# Patient Record
Sex: Male | Born: 1970
Health system: Southern US, Community
[De-identification: ages and names within clinical notes are randomized; demographics above are authoritative.]

## PROBLEM LIST (undated history)

## (undated) DIAGNOSIS — E119 Type 2 diabetes mellitus without complications: Secondary | ICD-10-CM

## (undated) DIAGNOSIS — I10 Essential (primary) hypertension: Secondary | ICD-10-CM

## (undated) DIAGNOSIS — E669 Obesity, unspecified: Secondary | ICD-10-CM

---

## 2004-10-02 ENCOUNTER — Ambulatory Visit: Payer: Self-pay | Admitting: Internal Medicine

## 2004-10-09 ENCOUNTER — Ambulatory Visit: Payer: Self-pay | Admitting: Internal Medicine

## 2004-10-09 ENCOUNTER — Ambulatory Visit (HOSPITAL_COMMUNITY): Admission: RE | Admit: 2004-10-09 | Discharge: 2004-10-09 | Payer: Self-pay | Admitting: Internal Medicine

## 2008-05-18 ENCOUNTER — Emergency Department (HOSPITAL_COMMUNITY): Admission: EM | Admit: 2008-05-18 | Discharge: 2008-05-18 | Payer: Self-pay | Admitting: Family Medicine

## 2010-12-01 NOTE — Consult Note (Signed)
NAME:  ENCARNACION, SCIONEAUX NO.:  [id number]   MEDICAL RECORD NO.:  [id number]           PATIENT TYPE:   LOCATION:                                 FACILITY:   PHYSICIAN:  Lionel December, M.D.         DATE OF BIRTH:   DATE OF CONSULTATION:  DATE OF DISCHARGE:                                   CONSULTATION   REQUESTING PHYSICIAN:  Dr. Gerhard Munch, [facility] Department.   REASON FOR CONSULTATION:  Rectal bleeding.   HISTORY OF PRESENT ILLNESS:  Mr. Postema is a 39 year old Hispanic male who  reports an eight-month history of intermittent rectal bleeding.  He noted  about a month ago he would have several episodes of small-volume rectal  bleeding which ranged anywhere from bright red to burgundy.  This persisted  on for a couple of weeks.  Then he had daily episodes for about two weeks.  He noted blood both on the toilet paper and in the commode.  He denies any  associated abdominal pain or rectal pain.  He denies any problems with  constipation, diarrhea or hemorrhoids.  He has been having a bowel movement  two to three times a day.  He denies any use of aspirin, and he does take an  occasional Motrin but not on a daily basis.  He has occasional heartburn or  indigestion, very infrequently, less than once a month, and he does not take  any medications for this.  He denies any dysphagia or odynophagia.  He is  otherwise healthy.   PAST MEDICAL HISTORY:  He denies any.   PAST SURGICAL HISTORY:  He denies any.   CURRENT MEDICATIONS:  Motrin p.r.n.   ALLERGIES:  No known drug allergies.   FAMILY HISTORY:  Non known family history of colorectal carcinoma, liver or  chronic GI problem.  There is a history in his mother, who is in her 53s,  with hypertension and diabetes mellitus.  He has multiple siblings, all of  whom are healthy.   SOCIAL HISTORY:  He has been married for 14 years.  He has six healthy  children, two sons and four daughters.  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  He reports occasional cigarette use, one to two per  week.  He reports drinking about a 12-pack of beer once a week, occasionally  a little more.  Denies any drug use.   REVIEW OF SYSTEMS:  CONSTITUTIONAL:  Weight is stable.  Denies any anorexia.  Denies any fatigue.  Denies any early satiety or insomnia.  CARDIOVASCULAR:  Denies any chest pain or palpitation.  PULMONARY:  Denies any shortness of  breath, dyspnea, cough or hemoptysis.  GASTROINTESTINAL:  See HPI.   PHYSICAL EXAMINATION:  VITAL SIGNS:  Weight 235 pounds, height 62 inches.  Temperature 98.4, blood pressure 118/64, pulse 88.  GENERAL:  Mr. Mechling is a 40 year old Hispanic male who is alert and  oriented, pleasant and cooperative, in no acute distress.  He is obese.  HEENT:  Sclerae clear, nonicteric.  Conjunctivae  pink.  Oropharynx pink and  moist without any lesions.  NECK:  Supple without adenopathy or thyromegaly.  CHEST:  Heart regular rate and rhythm with normal S1, S2, without any  murmurs, clicks, thrills or gallops.  Lungs clear to auscultation  bilaterally.  ABDOMEN:  Protuberant, obese, with positive bowel sounds x4.  No bruits  auscultated.  Soft, nontender, nondistended, without any palpable mass or  hepatosplenomegaly.  No rebound tenderness or guarding.  RECTAL:  No external lesion visualized.  Good sphincter tone.  There were no  internal masses palpated.  A small amount of light brown stool was obtained  from the vault, which is Hemoccult-negative.  EXTREMITIES:  2+ pedal pulses bilaterally.  No edema.  SKIN:  Brown, warm and dry, without any rash or jaundice.   IMPRESSION:  Mr. Krapf is a 40 year old Caucasian male with a history of  small to moderate amounts of rectal bleeding noted after defecation on the  toilet paper and in the commode for the last eight months.  He denies any  associated abdominal pain, rectal pain, constipation or diarrhea.  Most   likely his source of bleeding is benign anorectal source such as internal  hemorrhoids.  Less likely would be diverticular bleeding or ischemic  colitis.  Nonetheless, he needs further evaluation.  I have offered him  colonoscopy versus a flexible sigmoidoscopy given his age.  He prefers to  proceed with flexible sigmoidoscopy, and I feel this is appropriate in this  setting.   RECOMMENDATIONS:  1.  Will schedule flexible sigmoidoscopy with Dr. Karilyn Cota in the near future.      I have discussed this procedure including the risks and benefits with      Mr. Eggert.  He agrees with this.  A signed consent will be obtained.  2.  Further recommendations pending procedure.      KC/MEDQ  D:  10/03/2004  T:  10/03/2004  Job:  [id number]

## 2010-12-01 NOTE — Op Note (Signed)
NAMEKENYA, SHIRAISHI                  ACCOUNT NO.:  [id number]   MEDICAL RECORD NO.:  [id number]          PATIENT TYPE:  AMB   LOCATION:  DAY                           FACILITY:  [facility]   PHYSICIAN:  Lionel December, M.D.    DATE OF BIRTH:  1971-01-03   DATE OF PROCEDURE:  10/09/2004  DATE OF DISCHARGE:                                 OPERATIVE REPORT   PROCEDURE:  Flexible sigmoidoscopy.   INDICATIONS:  Estuardo is a 40 year old Hispanic male with intermittent  hematochezia of eight months' duration. He recently had several episodes. He  was seen by Mr. Criselda Peaches, [facility] department and referred  for further evaluation. States he has not had any more episodes. His bowels  move regularly and he denies abdominal pain. He is undergoing diagnostic  flexible sigmoidoscopy.   Procedure risks were reviewed the patient, informed consent was obtained.   PREMEDICATION:  None.   FINDINGS:  Procedure performed in endoscopy suite. The patient's vital signs  and O2 sat were monitored during procedure and remained stable. The patient  was placed left lateral position and Olympus videoscope was passed in rectum  and rectal examination performed. No abnormality noted on external or  digital exam. Olympus videoscope was placed in the rectum and advanced under  vision into sigmoid colon. Preparation was excellent.  Scope was passed to  the splenic flexure. As the scope was withdrawn, mucosa of descending and  sigmoid colon was carefully examined and was normal. Rectal mucosa similarly  was normal. Scope was retroflexed to examine anorectal junction. Anal canal  was not well seen on this view. However, as the scope was withdrawn,  hemorrhoids were noted involving the anal canal. Endoscope was withdrawn.  The patient tolerated the procedure well.   FINAL DIAGNOSIS:  External hemorrhoids, otherwise normal exam to splenic  flexure.   RECOMMENDATIONS:  He should continue with high-fiber diet.   He can use of Anusol-HC suppository if bleeding episodes recur.  If he does  not respond to therapy, he will give [place] a call.     NR/MEDQ  D:  10/09/2004  T:  10/09/2004  Job:  [id number]   cc:   Earleen Newport, [facility] Dept

## 2015-02-14 ENCOUNTER — Emergency Department (INDEPENDENT_AMBULATORY_CARE_PROVIDER_SITE_OTHER)
Admission: EM | Admit: 2015-02-14 | Discharge: 2015-02-14 | Disposition: A | Discharge status: Home / Self Care | Payer: Self-pay | Source: Home / Self Care | Attending: Internal Medicine | Admitting: Internal Medicine

## 2015-02-14 ENCOUNTER — Encounter (HOSPITAL_COMMUNITY): Payer: Self-pay | Admitting: Emergency Medicine

## 2015-02-14 DIAGNOSIS — G4733 Obstructive sleep apnea (adult) (pediatric): Secondary | ICD-10-CM

## 2015-02-14 DIAGNOSIS — L237 Allergic contact dermatitis due to plants, except food: Secondary | ICD-10-CM

## 2015-02-14 HISTORY — DX: Essential (primary) hypertension: I10

## 2015-02-14 MED ORDER — HYDROCORTISONE 2.5 % EX LOTN: TOPICAL_LOTION | Freq: Two times a day (BID) | CUTANEOUS | Status: DC

## 2015-02-14 NOTE — ED Provider Notes (Signed)
CSN: [id number]     Arrival date & time 02/14/15  1745 History   First MD Initiated Contact with Patient 02/14/15 2010     Chief Complaint  Patient presents with  . Hypertension   (Consider location/radiation/quality/duration/timing/severity/associated sxs/prior Treatment) HPI Comments: The patient reports that he has had some tingling and numbness on the left side of his face and the back of his head this morning when he awoke. He denies difficulty speaking or swallowing. He mentions that the symptoms are present when his blood pressure was high on previous doctor's visits. The patient is also concerned about exposure to poison ivy  The history is provided by the patient.    Past Medical History  Diagnosis Date  . Hypertension    History reviewed. No pertinent past surgical history. History reviewed. No pertinent family history. History  Substance Use Topics  . Smoking status: Never Smoker   . Smokeless tobacco: Never Used  . Alcohol Use: Yes     Comment: once a week    Review of Systems  Constitutional: Negative for fever.  Respiratory: Negative for shortness of breath.   Cardiovascular: Negative for chest pain and palpitations.  Gastrointestinal: Negative for nausea and vomiting.  Neurological: Positive for numbness. Negative for speech difficulty and headaches.       Numbness and tingling left side of face very briefly this morning    Allergies  Review of patient's allergies indicates no known allergies.  Home Medications   Prior to Admission medications   Not on File   BP 117/76 mmHg  Pulse 68  Temp(Src) 97.7 F (36.5 C) (Oral)  Resp 28  SpO2 96% Physical Exam  Constitutional: He is oriented to person, place, and time. He appears well-developed and well-nourished.  HENT:  Head: Normocephalic and atraumatic.  Mouth/Throat: Oropharynx is clear and moist.  Eyes: Conjunctivae and EOM are normal. Pupils are equal, round, and reactive to light. No scleral icterus.   Neck: Normal range of motion. Neck supple. No JVD present.  Cardiovascular: Normal rate, regular rhythm and normal heart sounds.  Exam reveals no gallop and no friction rub.   No murmur heard. Pulmonary/Chest: Effort normal and breath sounds normal.  Abdominal: Soft. Bowel sounds are normal. He exhibits no distension. There is no tenderness.  Musculoskeletal: Normal range of motion. He exhibits no edema.  Neurological: He is alert and oriented to person, place, and time. No cranial nerve deficit.  Skin: Skin is warm and dry. Rash noted.  Erythema and itching of right lower leg  Psychiatric: He has a normal mood and affect.    ED Course  Procedures (including critical care time) Labs Review Labs Reviewed - No data to display  Imaging Review No results found.   MDM  No diagnosis found. Risk reduction for CV event  Arnaldo Natal, MD 02/14/15 2132

## 2015-02-14 NOTE — ED Notes (Signed)
Pt states he had an episode this morning where he felt the left side of his head and neck felt numb.  He states he had a similar episode about two years ago and he was put on BP medications.  He stopped taking them after 6 months.  A few months ago he was prescribed medication for anxiety but he is no longer taking that as well.  Pt states he will feel short of breath or feel dizzy sometimes.

## 2015-02-14 NOTE — Discharge Instructions (Signed)
If you develop symptoms of numbness or weakness and urinary extremities or difficulty swallowing or speaking return to the emergency room immediately. I have recommended the patient begin taking a baby aspirin daily

## 2015-09-03 ENCOUNTER — Encounter (HOSPITAL_COMMUNITY): Payer: Self-pay | Admitting: *Deleted

## 2015-09-03 ENCOUNTER — Emergency Department (INDEPENDENT_AMBULATORY_CARE_PROVIDER_SITE_OTHER)
Admission: EM | Admit: 2015-09-03 | Discharge: 2015-09-03 | Disposition: A | Discharge status: Home / Self Care | Payer: Self-pay | Source: Home / Self Care | Attending: Emergency Medicine | Admitting: Emergency Medicine

## 2015-09-03 DIAGNOSIS — R519 Headache, unspecified: Secondary | ICD-10-CM

## 2015-09-03 DIAGNOSIS — R0789 Other chest pain: Secondary | ICD-10-CM

## 2015-09-03 DIAGNOSIS — R9431 Abnormal electrocardiogram [ECG] [EKG]: Secondary | ICD-10-CM

## 2015-09-03 DIAGNOSIS — R51 Headache: Secondary | ICD-10-CM

## 2015-09-03 HISTORY — DX: Obesity, unspecified: E66.9

## 2015-09-03 MED ORDER — PREDNISONE 50 MG PO TABS: 50.0000 mg | ORAL_TABLET | Freq: Every day | ORAL | Status: DC

## 2015-09-03 MED ORDER — IBUPROFEN 800 MG PO TABS: 800.0000 mg | ORAL_TABLET | Freq: Three times a day (TID) | ORAL | Status: DC | PRN

## 2015-09-03 NOTE — ED Provider Notes (Signed)
HPI  SUBJECTIVE:  Douglas Hayes is a 45 y.o. male who presents with 2 complaints, first, he reports a throbbing, posterior headache, which is now moved to the front of his head. States it feels like a band around his head. States that he has been doing a lot of coughing starting yesterday as well. This started yesterday. He states he has had similar headaches with elevated blood pressure. The headaches last about 30 minutes to an hour and then resolve. He denies nausea, vomiting, nasal congestion, rhinorrhea, neck stiffness, or leg weakness, visual changes, dysarthria, facial droop, photophobia. There are no aggravating or alleviating factors. He has not tried anything for this.   He also reports intermittent, substernal chest pain described as soreness after coughing. It is present only after coughing, and sometimes aggravated with movement/torso rotation. No alleviating factors. He has not tried anything for this. He denies any exertional or positional component to it. There is no radiation up to his neck, through  his chest, or down his arm. He denies nausea, vomiting, diaphoresis, abdominal pain, GERD symptoms. States that the chest pain is not related to the headaches. Patient has a past medical history of hypertension, stopped his medications 3 years ago. No history of MI, diabetes, abnormal EKG, smoking, hypercholesterolemia. Family history negative for MI. PMD: None.    Past Medical History  Diagnosis Date  . Hypertension     off meds since 2016  . Obesity     No past surgical history on file.  No family history on file.  Social History  Substance Use Topics  . Smoking status: Never Smoker   . Smokeless tobacco: Never Used  . Alcohol Use: Yes     Comment: occasionally    No current facility-administered medications for this encounter.  Current outpatient prescriptions:  .  hydrocortisone 2.5 % lotion, Apply topically 2 (two) times daily., Disp: 59 mL, Rfl: 0 .  ibuprofen  (ADVIL,MOTRIN) 800 MG tablet, Take 1 tablet (800 mg total) by mouth every 8 (eight) hours as needed., Disp: 30 tablet, Rfl: 0 .  predniSONE (DELTASONE) 50 MG tablet, Take 1 tablet (50 mg total) by mouth daily with breakfast., Disp: 5 tablet, Rfl: 0  No Known Allergies   ROS  As noted in HPI.   Physical Exam  BP 107/66 mmHg  Pulse 82  Temp(Src) 98.9 F (37.2 C) (Oral)  Resp 16  SpO2 98%  Constitutional: Well developed, well nourished, no acute distress Eyes: PERRL, EOMI, conjunctiva normal bilaterally HENT: Normocephalic, atraumatic,mucus membranes moist. No sinus tenderness. No nasal congestion. Oropharynx normal.  Neck: Positive mild tenderness along bilateral trapezius. C-spine normal. Respiratory: Clear to auscultation bilaterally, no rales, no wheezing, no rhonchi Cardiovascular: Normal rate and rhythm, no murmurs, no gallops, no rubs. No chest wall tenderness. RP 2+ and equal bilaterally. GI: Soft, nondistended, normal bowel sounds, nontender, no rebound, no guarding skin: No rash, skin intact Musculoskeletal: No edema, no tenderness, no deformities Neurologic: Alert & oriented x 3, CN II-XII  intact, no motor deficits, sensation grossly intact Psychiatric: Speech and behavior appropriate   ED Course   Medications - No data to display  Orders Placed This Encounter  Procedures  . ED EKG    Standing Status: Standing     Number of Occurrences: 1     Standing Expiration Date:     Order Specific Question:  Reason for Exam    Answer:  Chest Pain   No results found for this or any previous visit (from  the past 24 hour(s)). No results found.  ED Clinical Impression  Acute nonintractable headache, unspecified headache type  Musculoskeletal chest pain  Abnormal EKG   ED Assessment/Plan  EKG: Normal sinus rhythm, rate 82. Normal axis, normal intervals. Isolated Q wave with T wave inversion in 3, with no reciprocal changes in any of the leads. No previous EKG for  comparison.  Presentation consistent with a musculoskeletal headache, this does not appear to be evidence of stroke or other neurologic emergency. No evidence of sinusitis, otitis, pharyngitis. Chest pain seems to be musculoskeletal as well, as present after coughing only and there is no other exertional positional component. Vitals are normal.  EKG as noted, but there are no EKG changes suggestive of acute ischemia with no reciprocal changes. Plan to send home with NSAIDs, 5 days of prednisone, to cover both headache and the chest pain. will refer to primary care. Had strict discussion on when to go to the emergency room. Patient agrees with plan.   *This clinic note was created using Dragon dictation software. Therefore, there may be occasional mistakes despite careful proofreading.  ?  Domenick Gong, MD 09/03/15 [phone]

## 2015-09-03 NOTE — Discharge Instructions (Signed)
Go to the emergency room if your chest pain changes, gets worse, if you start having some sweatiness, nausea, chest pain, if you have trouble breathing, or for any other concerns. You need to follow up with a primary care physician. See the list below.    Go to www.goodrx.com to look up your medications. This will give you a list of where you can find your prescriptions at the most affordable prices.   This practice is taking new patients. They will see you even if you do not have insurance.  [facility] [address] [place], [place]  [address] [phone]  If you have no primary doctor, here are some resources that may be helpful:  Medicaid-accepting [facility] Providers: - [facility]- [address]  [phone];   - [facility]- [address] [place], [address] [phone]  - [facility]- [address] [phone]  [facility]- [address] [phone]  - Renaye Rakers- [address], [address] [phone]. Only accepts [place] patients after they have her name applied to their card  -Dr. Greggory Stallion Osei-Bonsu, [facility]. [address]    [place], [place] [address]  [phone]  Self Pay (no insurance) in [place]: - Sickle Cell Patients: Dr Willey Blade, [facility] [address] [place] [phone]  - Health [phone]  - Physician Referral Service- [phone]  - [facility]- [address], [place], [phone];  Monday to Friday, 9 a.m. - 7 p.m.; Saturday 9 a.m. to 1 p.m.  [facility]- [address] [place], [place] [phone]  - [facility]- [address]      [phone] - [facility]- [address] [phone]  [facility], [address] [place]; [phone]; or [address] [place]; [phone].   [facility], [place]. [address] [place]; [phone]; Monday to Wednesday, 8:30 a.m. - 5 p.m.; Thursday, 8:30 a.m. - 8 p.m.  [facility], [address], [place]; [phone]; Monday to Friday, 8 a.m. - 4:30 p.m.   [facility], [address] [place], [phone]; first and third Saturday of the month, 9:30 a.m. - 12:30 p.m.  [facility], [address], [place], [phone]; second Saturday of the month, 9 a.m. -noon.  [facility] for children. For information, call [phone]; [phone]; or [phone].  Other agencies that provide inexpensive medical care:     [facility]  [phone]    [facility]  [phone]    [facility]  [phone] [address] [place] [address]    [facility]  [phone]    [facility]  [phone], [phone]; or [phone].  Chronic Pain Problems Contact [facility]  [phone] Patients need to be referred by their primary care doctor.  [facility]  [facility]     [employer]                          [facility] Dept. [address] [place]  [address]      [address] [place] [address]   [phone] (After Hours)  General Information: Finding a doctor when you do not have health insurance can be tricky. Although you are not limited by an insurance plan, you are of course limited by her finances and how much but he can pay out of pocket.  What are your options if you don't have health insurance?   1) Find a Librarian, academic and Pay Out of Pocket Although you won't have to find out who is covered by your insurance plan, it is a good idea to ask around and get recommendations. You will then need to call the office and see if the doctor you have chosen will accept you as a new patient and what types of  options they offer for patients who are self-pay. Some doctors offer discounts or will set up payment plans for their patients who do not have insurance, but you will need to ask so you aren't surprised when you get to your appointment.  2) Contact Your Local Health Department Not all health departments have doctors that can see patients for sick visits, but many do, so it is worth a call to see if yours does. If you don't know where your local health department is, you can check in your phone book. The CDC also has a tool to help you locate your state's health department, and many state websites also have listings of all of their local health departments.  3) Find a Walk-in Clinic If your illness is not likely to be very severe or complicated, you may want to try a walk in clinic. These are popping up all over the country in pharmacies, drugstores, and shopping centers. They're usually staffed by nurse practitioners or physician assistants that have been trained to treat common illnesses and complaints. They're usually fairly quick and inexpensive. However, if you have serious medical issues or chronic medical problems, these are probably not your best option

## 2015-09-03 NOTE — ED Notes (Addendum)
C/O mid-upper chest pressure since yesterday; pain has been intermittent.  Denies any radiation of pain; no change with palpation or movement.  Denies SOB, n/v, diaphoresis.  Has hx HTN, but stopped meds last year.  Last night also started with posterior neck pain, now radiating up into frontal HA.  States he did start with a slight cough yesterday.

## 2015-10-10 ENCOUNTER — Encounter (HOSPITAL_COMMUNITY): Payer: Self-pay | Admitting: *Deleted

## 2015-10-10 DIAGNOSIS — K3589 Other acute appendicitis: Principal | ICD-10-CM | POA: Insufficient documentation

## 2015-10-10 LAB — CBC
HCT: 42.7 % (ref 39.0–52.0)
HEMOGLOBIN: 14.5 g/dL (ref 13.0–17.0)
MCH: 28.5 pg (ref 26.0–34.0)
MCHC: 34 g/dL (ref 30.0–36.0)
MCV: 84.1 fL (ref 78.0–100.0)
PLATELETS: 222 10*3/uL (ref 150–400)
RBC: 5.08 MIL/uL (ref 4.22–5.81)
RDW: 14.1 % (ref 11.5–15.5)
WBC: 15.3 10*3/uL — AB (ref 4.0–10.5)

## 2015-10-10 LAB — LIPASE, BLOOD: LIPASE: 26 U/L (ref 11–51)

## 2015-10-10 LAB — COMPREHENSIVE METABOLIC PANEL
ALT: 25 U/L (ref 17–63)
ANION GAP: 8 (ref 5–15)
AST: 26 U/L (ref 15–41)
Albumin: 3.9 g/dL (ref 3.5–5.0)
Alkaline Phosphatase: 35 U/L — ABNORMAL LOW (ref 38–126)
BUN: 17 mg/dL (ref 6–20)
CHLORIDE: 102 mmol/L (ref 101–111)
CO2: 24 mmol/L (ref 22–32)
Calcium: 8.5 mg/dL — ABNORMAL LOW (ref 8.9–10.3)
Creatinine, Ser: 0.72 mg/dL (ref 0.61–1.24)
GFR calc non Af Amer: >60 mL/min (ref >60)
Glucose, Bld: 146 mg/dL — ABNORMAL HIGH (ref 65–99)
POTASSIUM: 3.8 mmol/L (ref 3.5–5.1)
SODIUM: 134 mmol/L — AB (ref 135–145)
Total Bilirubin: 0.6 mg/dL (ref 0.3–1.2)
Total Protein: 7.3 g/dL (ref 6.5–8.1)

## 2015-10-10 NOTE — ED Notes (Signed)
Pt c/o n/v and abdominal pain that started today 

## 2015-10-11 ENCOUNTER — Encounter (HOSPITAL_COMMUNITY)
Admission: EM | Disposition: A | Discharge status: Home / Self Care | Payer: Self-pay | Source: Home / Self Care | Attending: Emergency Medicine

## 2015-10-11 ENCOUNTER — Encounter (HOSPITAL_COMMUNITY): Payer: Self-pay

## 2015-10-11 ENCOUNTER — Emergency Department (HOSPITAL_COMMUNITY): Payer: Self-pay

## 2015-10-11 ENCOUNTER — Observation Stay (HOSPITAL_COMMUNITY)
Admission: EM | Admit: 2015-10-11 | Discharge: 2015-10-12 | Disposition: A | Discharge status: Home / Self Care | Payer: Self-pay | Attending: General Surgery | Admitting: General Surgery

## 2015-10-11 ENCOUNTER — Emergency Department (HOSPITAL_COMMUNITY): Payer: Self-pay | Admitting: Anesthesiology

## 2015-10-11 DIAGNOSIS — K3580 Unspecified acute appendicitis: Secondary | ICD-10-CM | POA: Diagnosis present

## 2015-10-11 DIAGNOSIS — K3589 Other acute appendicitis without perforation or gangrene: Secondary | ICD-10-CM

## 2015-10-11 HISTORY — PX: LAPAROSCOPIC APPENDECTOMY: SHX408

## 2015-10-11 LAB — URINALYSIS, ROUTINE W REFLEX MICROSCOPIC
BILIRUBIN URINE: NEGATIVE
Glucose, UA: NEGATIVE mg/dL
HGB URINE DIPSTICK: NEGATIVE
Ketones, ur: NEGATIVE mg/dL
Leukocytes, UA: NEGATIVE
Nitrite: NEGATIVE
PROTEIN: NEGATIVE mg/dL
SPECIFIC GRAVITY, URINE: 1.01 (ref 1.005–1.030)
pH: 8 (ref 5.0–8.0)

## 2015-10-11 SURGERY — APPENDECTOMY, LAPAROSCOPIC
Anesthesia: General | Site: Abdomen

## 2015-10-11 MED ORDER — FENTANYL CITRATE (PF) 250 MCG/5ML IJ SOLN
INTRAMUSCULAR | Status: AC
  Filled 2015-10-11: qty 5

## 2015-10-11 MED ORDER — LORAZEPAM 2 MG/ML IJ SOLN: 1.0000 mg | INTRAMUSCULAR | Status: DC | PRN

## 2015-10-11 MED ORDER — ACETAMINOPHEN 650 MG RE SUPP: 650.0000 mg | Freq: Four times a day (QID) | RECTAL | Status: DC | PRN

## 2015-10-11 MED ORDER — PROPOFOL 10 MG/ML IV BOLUS
INTRAVENOUS | Status: AC
  Filled 2015-10-11: qty 20

## 2015-10-11 MED ORDER — GLYCOPYRROLATE 0.2 MG/ML IJ SOLN
INTRAMUSCULAR | Status: AC
  Filled 2015-10-11: qty 1

## 2015-10-11 MED ORDER — GLYCOPYRROLATE 0.2 MG/ML IJ SOLN
INTRAMUSCULAR | Status: AC
  Filled 2015-10-11: qty 3

## 2015-10-11 MED ORDER — ONDANSETRON HCL 4 MG/2ML IJ SOLN
INTRAMUSCULAR | Status: AC
  Filled 2015-10-11: qty 2

## 2015-10-11 MED ORDER — LIDOCAINE HCL (CARDIAC) 10 MG/ML IV SOLN
INTRAVENOUS | Status: DC | PRN
  Administered 2015-10-11: 50 mg via INTRAVENOUS

## 2015-10-11 MED ORDER — FENTANYL CITRATE (PF) 100 MCG/2ML IJ SOLN
25.0000 ug | INTRAMUSCULAR | Status: DC | PRN
  Administered 2015-10-11: 50 ug via INTRAVENOUS
  Filled 2015-10-11: qty 2

## 2015-10-11 MED ORDER — LIDOCAINE HCL (PF) 1 % IJ SOLN
INTRAMUSCULAR | Status: AC
  Filled 2015-10-11: qty 5

## 2015-10-11 MED ORDER — ONDANSETRON HCL 4 MG/2ML IJ SOLN
4.0000 mg | Freq: Once | INTRAMUSCULAR | Status: AC
  Administered 2015-10-11: 4 mg via INTRAVENOUS
  Filled 2015-10-11: qty 2

## 2015-10-11 MED ORDER — OXYCODONE-ACETAMINOPHEN 5-325 MG PO TABS
1.0000 | ORAL_TABLET | ORAL | Status: DC | PRN
  Administered 2015-10-11 (×2): 2 via ORAL
  Filled 2015-10-11 (×2): qty 2

## 2015-10-11 MED ORDER — POVIDONE-IODINE 10 % EX OINT
TOPICAL_OINTMENT | CUTANEOUS | Status: AC
  Filled 2015-10-11: qty 1

## 2015-10-11 MED ORDER — ONDANSETRON HCL 4 MG/2ML IJ SOLN: 4.0000 mg | Freq: Four times a day (QID) | INTRAMUSCULAR | Status: DC | PRN

## 2015-10-11 MED ORDER — SIMETHICONE 80 MG PO CHEW: 40.0000 mg | CHEWABLE_TABLET | Freq: Four times a day (QID) | ORAL | Status: DC | PRN

## 2015-10-11 MED ORDER — DEXAMETHASONE SODIUM PHOSPHATE 4 MG/ML IJ SOLN
INTRAMUSCULAR | Status: AC
Start: 2015-10-11 — End: 2015-10-11
  Filled 2015-10-11: qty 1

## 2015-10-11 MED ORDER — ONDANSETRON HCL 4 MG/2ML IJ SOLN: 4.0000 mg | Freq: Once | INTRAMUSCULAR | Status: DC | PRN

## 2015-10-11 MED ORDER — ENOXAPARIN SODIUM 40 MG/0.4ML ~~LOC~~ SOLN: 40.0000 mg | SUBCUTANEOUS | Status: DC

## 2015-10-11 MED ORDER — SUCCINYLCHOLINE CHLORIDE 20 MG/ML IJ SOLN
INTRAMUSCULAR | Status: AC
  Filled 2015-10-11: qty 1

## 2015-10-11 MED ORDER — PIPERACILLIN-TAZOBACTAM 3.375 G IVPB 30 MIN
3.3750 g | Freq: Once | INTRAVENOUS | Status: AC
  Administered 2015-10-11: 3.375 g via INTRAVENOUS
  Filled 2015-10-11: qty 50

## 2015-10-11 MED ORDER — POVIDONE-IODINE 10 % OINT PACKET
TOPICAL_OINTMENT | CUTANEOUS | Status: DC | PRN
  Administered 2015-10-11: 1 via TOPICAL

## 2015-10-11 MED ORDER — INFLUENZA VAC SPLIT QUAD 0.5 ML IM SUSY
0.5000 mL | PREFILLED_SYRINGE | INTRAMUSCULAR | Status: AC
Start: 2015-10-12 — End: 2015-10-12
  Administered 2015-10-12: 0.5 mL via INTRAMUSCULAR
  Filled 2015-10-11 (×2): qty 0.5

## 2015-10-11 MED ORDER — PROPOFOL 10 MG/ML IV BOLUS
INTRAVENOUS | Status: DC | PRN
  Administered 2015-10-11: 20 mg via INTRAVENOUS
  Administered 2015-10-11: 180 mg via INTRAVENOUS

## 2015-10-11 MED ORDER — FENTANYL CITRATE (PF) 100 MCG/2ML IJ SOLN
INTRAMUSCULAR | Status: DC | PRN
  Administered 2015-10-11 (×2): 50 ug via INTRAVENOUS

## 2015-10-11 MED ORDER — BUPIVACAINE HCL (PF) 0.5 % IJ SOLN
INTRAMUSCULAR | Status: DC | PRN
  Administered 2015-10-11: 10 mL

## 2015-10-11 MED ORDER — MIDAZOLAM HCL 2 MG/2ML IJ SOLN
INTRAMUSCULAR | Status: AC
  Filled 2015-10-11: qty 2

## 2015-10-11 MED ORDER — DEXAMETHASONE SODIUM PHOSPHATE 4 MG/ML IJ SOLN
4.0000 mg | Freq: Once | INTRAMUSCULAR | Status: AC
  Administered 2015-10-11: 4 mg via INTRAVENOUS

## 2015-10-11 MED ORDER — MORPHINE SULFATE (PF) 4 MG/ML IV SOLN
4.0000 mg | Freq: Once | INTRAVENOUS | Status: AC
Start: 2015-10-11 — End: 2015-10-11
  Administered 2015-10-11: 4 mg via INTRAVENOUS
  Filled 2015-10-11: qty 1

## 2015-10-11 MED ORDER — ONDANSETRON HCL 4 MG/2ML IJ SOLN
4.0000 mg | Freq: Once | INTRAMUSCULAR | Status: AC
  Administered 2015-10-11: 4 mg via INTRAVENOUS

## 2015-10-11 MED ORDER — KETOROLAC TROMETHAMINE 30 MG/ML IJ SOLN
30.0000 mg | Freq: Once | INTRAMUSCULAR | Status: AC
  Administered 2015-10-11: 30 mg via INTRAVENOUS
  Filled 2015-10-11: qty 1

## 2015-10-11 MED ORDER — IOPAMIDOL (ISOVUE-300) INJECTION 61%
100.0000 mL | Freq: Once | INTRAVENOUS | Status: AC | PRN
  Administered 2015-10-11: 100 mL via INTRAVENOUS

## 2015-10-11 MED ORDER — NEOSTIGMINE METHYLSULFATE 10 MG/10ML IV SOLN
INTRAVENOUS | Status: DC | PRN
  Administered 2015-10-11 (×2): 1 mg via INTRAVENOUS
  Administered 2015-10-11: 4 mg via INTRAVENOUS

## 2015-10-11 MED ORDER — ROCURONIUM BROMIDE 50 MG/5ML IV SOLN
INTRAVENOUS | Status: AC
  Filled 2015-10-11: qty 2

## 2015-10-11 MED ORDER — GLYCOPYRROLATE 0.2 MG/ML IJ SOLN
INTRAMUSCULAR | Status: DC | PRN
  Administered 2015-10-11: 0.6 mg via INTRAVENOUS
  Administered 2015-10-11 (×2): 0.2 mg via INTRAVENOUS

## 2015-10-11 MED ORDER — HYDROMORPHONE HCL 1 MG/ML IJ SOLN: 1.0000 mg | INTRAMUSCULAR | Status: DC | PRN

## 2015-10-11 MED ORDER — ACETAMINOPHEN 325 MG PO TABS: 650.0000 mg | ORAL_TABLET | Freq: Four times a day (QID) | ORAL | Status: DC | PRN

## 2015-10-11 MED ORDER — SODIUM CHLORIDE 0.9 % IR SOLN
Status: DC | PRN
  Administered 2015-10-11: 1000 mL

## 2015-10-11 MED ORDER — DIPHENHYDRAMINE HCL 50 MG/ML IJ SOLN: 25.0000 mg | Freq: Four times a day (QID) | INTRAMUSCULAR | Status: DC | PRN

## 2015-10-11 MED ORDER — SUCCINYLCHOLINE CHLORIDE 20 MG/ML IJ SOLN
INTRAMUSCULAR | Status: DC | PRN
  Administered 2015-10-11: 140 mg via INTRAVENOUS

## 2015-10-11 MED ORDER — MIDAZOLAM HCL 2 MG/2ML IJ SOLN
1.0000 mg | INTRAMUSCULAR | Status: DC | PRN
  Administered 2015-10-11: 2 mg via INTRAVENOUS

## 2015-10-11 MED ORDER — LACTATED RINGERS IV SOLN
INTRAVENOUS | Status: DC
  Administered 2015-10-11 (×2): via INTRAVENOUS

## 2015-10-11 MED ORDER — PIPERACILLIN-TAZOBACTAM 3.375 G IVPB
3.3750 g | Freq: Three times a day (TID) | INTRAVENOUS | Status: DC
  Administered 2015-10-11 – 2015-10-12 (×3): 3.375 g via INTRAVENOUS
  Filled 2015-10-11 (×6): qty 50

## 2015-10-11 MED ORDER — LACTATED RINGERS IV SOLN
INTRAVENOUS | Status: DC
  Administered 2015-10-11: 14:00:00 via INTRAVENOUS

## 2015-10-11 MED ORDER — BUPIVACAINE HCL (PF) 0.5 % IJ SOLN
INTRAMUSCULAR | Status: AC
  Filled 2015-10-11: qty 30

## 2015-10-11 MED ORDER — DIPHENHYDRAMINE HCL 25 MG PO CAPS: 25.0000 mg | ORAL_CAPSULE | Freq: Four times a day (QID) | ORAL | Status: DC | PRN

## 2015-10-11 MED ORDER — ONDANSETRON 4 MG PO TBDP: 4.0000 mg | ORAL_TABLET | Freq: Four times a day (QID) | ORAL | Status: DC | PRN

## 2015-10-11 MED ORDER — SODIUM CHLORIDE 0.9 % IV BOLUS (SEPSIS)
1000.0000 mL | Freq: Once | INTRAVENOUS | Status: AC
  Administered 2015-10-11: 1000 mL via INTRAVENOUS

## 2015-10-11 MED ORDER — ROCURONIUM BROMIDE 100 MG/10ML IV SOLN
INTRAVENOUS | Status: DC | PRN
  Administered 2015-10-11: 35 mg via INTRAVENOUS
  Administered 2015-10-11: 5 mg via INTRAVENOUS
  Administered 2015-10-11: 10 mg via INTRAVENOUS

## 2015-10-11 SURGICAL SUPPLY — 51 items
APPLICATOR COTTON TIP 6IN STRL (MISCELLANEOUS) ×2 IMPLANT
BAG HAMPER (MISCELLANEOUS) ×3 IMPLANT
BAG SPEC RTRVL LRG 6X4 10 (ENDOMECHANICALS) ×1
CHLORAPREP W/TINT 26ML (MISCELLANEOUS) ×3 IMPLANT
CLOTH BEACON ORANGE TIMEOUT ST (SAFETY) ×3 IMPLANT
COVER LIGHT HANDLE STERIS (MISCELLANEOUS) ×6 IMPLANT
CUTTER FLEX LINEAR 45M (STAPLE) ×2 IMPLANT
CUTTER LINEAR ENDO 35 ART FLEX (STAPLE) IMPLANT
DECANTER SPIKE VIAL GLASS SM (MISCELLANEOUS) ×3 IMPLANT
ELECT REM PT RETURN 9FT ADLT (ELECTROSURGICAL) ×3
ELECTRODE REM PT RTRN 9FT ADLT (ELECTROSURGICAL) ×1 IMPLANT
EVACUATOR SMOKE 8.L (FILTER) ×3 IMPLANT
FORMALIN 10 PREFIL 120ML (MISCELLANEOUS) ×3 IMPLANT
GLOVE BIO SURGEON STRL SZ7 (GLOVE) ×2 IMPLANT
GLOVE BIOGEL PI IND STRL 7.0 (GLOVE) ×1 IMPLANT
GLOVE BIOGEL PI INDICATOR 7.0 (GLOVE) ×2
GLOVE EXAM NITRILE MD LF STRL (GLOVE) ×2 IMPLANT
GLOVE SURG SS PI 7.5 STRL IVOR (GLOVE) ×3 IMPLANT
GOWN STRL REUS W/ TWL XL LVL3 (GOWN DISPOSABLE) ×1 IMPLANT
GOWN STRL REUS W/TWL LRG LVL3 (GOWN DISPOSABLE) ×3 IMPLANT
GOWN STRL REUS W/TWL XL LVL3 (GOWN DISPOSABLE) ×3
INST SET LAPROSCOPIC AP (KITS) ×3 IMPLANT
IV NS IRRIG 3000ML ARTHROMATIC (IV SOLUTION) IMPLANT
KIT ROOM TURNOVER APOR (KITS) ×3 IMPLANT
MANIFOLD NEPTUNE II (INSTRUMENTS) ×3 IMPLANT
NDL INSUFFLATION 14GA 120MM (NEEDLE) ×1 IMPLANT
NEEDLE INSUFFLATION 14GA 120MM (NEEDLE) ×3 IMPLANT
NS IRRIG 1000ML POUR BTL (IV SOLUTION) ×3 IMPLANT
PACK LAP CHOLE LZT030E (CUSTOM PROCEDURE TRAY) ×3 IMPLANT
PAD ARMBOARD 7.5X6 YLW CONV (MISCELLANEOUS) ×3 IMPLANT
PENCIL HANDSWITCHING (ELECTRODE) ×2 IMPLANT
POUCH SPECIMEN RETRIEVAL 10MM (ENDOMECHANICALS) ×3 IMPLANT
RELOAD 45 VASCULAR/THIN (ENDOMECHANICALS) IMPLANT
RELOAD STAPLE 45 2.5 WHT GRN (ENDOMECHANICALS) IMPLANT
RELOAD STAPLE 45 3.5 BLU ETS (ENDOMECHANICALS) ×1 IMPLANT
RELOAD STAPLE TA45 3.5 REG BLU (ENDOMECHANICALS) ×3 IMPLANT
SET BASIN LINEN APH (SET/KITS/TRAYS/PACK) ×3 IMPLANT
SET TUBE IRRIG SUCTION NO TIP (IRRIGATION / IRRIGATOR) IMPLANT
SHEARS HARMONIC ACE PLUS 36CM (ENDOMECHANICALS) ×3 IMPLANT
SPONGE GAUZE 2X2 8PLY STER LF (GAUZE/BANDAGES/DRESSINGS) ×3
SPONGE GAUZE 2X2 8PLY STRL LF (GAUZE/BANDAGES/DRESSINGS) ×6 IMPLANT
STAPLER VISISTAT (STAPLE) ×3 IMPLANT
SUT VICRYL 0 UR6 27IN ABS (SUTURE) ×3 IMPLANT
TAPE CLOTH SURG 4X10 WHT LF (GAUZE/BANDAGES/DRESSINGS) ×2 IMPLANT
TRAY FOLEY CATH SILVER 16FR (SET/KITS/TRAYS/PACK) ×3 IMPLANT
TROCAR ENDO BLADELESS 11MM (ENDOMECHANICALS) ×3 IMPLANT
TROCAR ENDO BLADELESS 12MM (ENDOMECHANICALS) ×3 IMPLANT
TROCAR XCEL NON-BLD 5MMX100MML (ENDOMECHANICALS) ×3 IMPLANT
TUBING INSUFFLATION (TUBING) ×3 IMPLANT
WARMER LAPAROSCOPE (MISCELLANEOUS) ×3 IMPLANT
YANKAUER SUCT 12FT TUBE ARGYLE (SUCTIONS) ×1 IMPLANT

## 2015-10-11 NOTE — H&P (Signed)
Douglas Hayes is an 45 y.o. male.   Chief Complaint: Right-sided abdominal pain HPI: Patient is a 45 year old Hispanic male who presents with a less than 24-hour history of worsening right sided abdominal pain. He has never had this pain before. CT scan of the abdomen reveals acute appendicitis. There was no evidence of perforation.  History reviewed. No pertinent past medical history.  History reviewed. No pertinent past surgical history.  History reviewed. No pertinent family history. Social History:  reports that he has never smoked. He does not have any smokeless tobacco history on file. He reports that he drinks alcohol. He reports that he does not use illicit drugs.  Allergies: No Known Allergies   (Not in a hospital admission)  Results for orders placed or performed during the hospital encounter of 10/11/15 (from the past 48 hour(s))  Lipase, blood     Status: None   Collection Time: 10/10/15 11:18 PM  Result Value Ref Range   Lipase 26 11 - 51 U/L  Comprehensive metabolic panel     Status: Abnormal   Collection Time: 10/10/15 11:18 PM  Result Value Ref Range   Sodium 134 (L) 135 - 145 mmol/L   Potassium 3.8 3.5 - 5.1 mmol/L   Chloride 102 101 - 111 mmol/L   CO2 24 22 - 32 mmol/L   Glucose, Bld 146 (H) 65 - 99 mg/dL   BUN 17 6 - 20 mg/dL   Creatinine, Ser 0.72 0.61 - 1.24 mg/dL   Calcium 8.5 (L) 8.9 - 10.3 mg/dL   Total Protein 7.3 6.5 - 8.1 g/dL   Albumin 3.9 3.5 - 5.0 g/dL   AST 26 15 - 41 U/L   ALT 25 17 - 63 U/L   Alkaline Phosphatase 35 (L) 38 - 126 U/L   Total Bilirubin 0.6 0.3 - 1.2 mg/dL   GFR calc non Af Amer >60 >60 mL/min   GFR calc Af Amer >60 >60 mL/min    Comment: (NOTE) The eGFR has been calculated using the CKD EPI equation. This calculation has not been validated in all clinical situations. eGFR's persistently <60 mL/min signify possible Chronic Kidney Disease.    Anion gap 8 5 - 15  CBC     Status: Abnormal   Collection Time: 10/10/15  11:18 PM  Result Value Ref Range   WBC 15.3 (H) 4.0 - 10.5 K/uL   RBC 5.08 4.22 - 5.81 MIL/uL   Hemoglobin 14.5 13.0 - 17.0 g/dL   HCT 42.7 39.0 - 52.0 %   MCV 84.1 78.0 - 100.0 fL   MCH 28.5 26.0 - 34.0 pg   MCHC 34.0 30.0 - 36.0 g/dL   RDW 14.1 11.5 - 15.5 %   Platelets 222 150 - 400 K/uL  Urinalysis, Routine w reflex microscopic (not at [facility])     Status: Abnormal   Collection Time: 10/11/15  1:06 AM  Result Value Ref Range   Color, Urine AMBER (A) YELLOW    Comment: BIOCHEMICALS MAY BE AFFECTED BY COLOR   APPearance CLEAR CLEAR   Specific Gravity, Urine 1.010 1.005 - 1.030   pH 8.0 5.0 - 8.0   Glucose, UA NEGATIVE NEGATIVE mg/dL   Hgb urine dipstick NEGATIVE NEGATIVE   Bilirubin Urine NEGATIVE NEGATIVE   Ketones, ur NEGATIVE NEGATIVE mg/dL   Protein, ur NEGATIVE NEGATIVE mg/dL   Nitrite NEGATIVE NEGATIVE   Leukocytes, UA NEGATIVE NEGATIVE    Comment: MICROSCOPIC NOT DONE ON URINES WITH NEGATIVE PROTEIN, BLOOD, LEUKOCYTES, NITRITE, OR GLUCOSE <1000  mg/dL.   Ct Abdomen Pelvis W Contrast  10/11/2015  CLINICAL DATA:  Acute onset right lower quadrant abdominal pain. Nausea and vomiting. Initial encounter. EXAM: CT ABDOMEN AND PELVIS WITH CONTRAST TECHNIQUE: Multidetector CT imaging of the abdomen and pelvis was performed using the standard protocol following bolus administration of intravenous contrast. CONTRAST:  169m ISOVUE-300 IOPAMIDOL (ISOVUE-300) INJECTION 61% COMPARISON:  None. FINDINGS: The visualized lung bases are clear. The liver and spleen are unremarkable in appearance. The gallbladder is within normal limits. The pancreas and adrenal glands are unremarkable. The kidneys are unremarkable in appearance. There is no evidence of hydronephrosis. No renal or ureteral stones are seen. No perinephric stranding is appreciated. The small bowel is unremarkable in appearance. The stomach is within normal limits. No acute vascular abnormalities are seen. The appendix is dilated to 1.5 cm  in maximal diameter, with surrounding soft tissue inflammation and trace fluid, compatible with acute appendicitis. There is no evidence of perforation or abscess formation at this time. The colon is largely decompressed and is unremarkable in appearance. The bladder is moderately distended and grossly unremarkable. The prostate remains normal in size. A small left inguinal hernia is seen, containing only fat. No inguinal lymphadenopathy is seen. No acute osseous abnormalities are identified. IMPRESSION: 1. Acute appendicitis, with dilatation of the appendix to 1.5 cm in maximal diameter, surrounding soft tissue inflammation and trace fluid. No evidence of perforation or abscess formation at this time. 2. Small left inguinal hernia, containing only fat. These results were called by telephone at the time of interpretation on 10/11/2015 at 5:02 am to Dr. CThayer Jew who verbally acknowledged these results. Electronically Signed   By: JGarald BaldingM.D.   On: 10/11/2015 05:03    Review of Systems  Constitutional: Positive for malaise/fatigue.  HENT: Negative.   Eyes: Negative.   Respiratory: Negative.   Cardiovascular: Negative.   Gastrointestinal: Positive for abdominal pain.  Genitourinary: Negative.   Musculoskeletal: Negative.   Skin: Negative.     Blood pressure 126/75, pulse 73, temperature 98.3 F (36.8 C), temperature source Oral, resp. rate 17, height '5\' 6"'$  (1.676 m), weight 118.842 kg (262 lb), SpO2 94 %. Physical Exam  Vitals reviewed. Constitutional: He is oriented to person, place, and time. He appears well-developed and well-nourished.  HENT:  Head: Normocephalic and atraumatic.  Neck: Normal range of motion. Neck supple.  Cardiovascular: Normal rate, regular rhythm and normal heart sounds.   Respiratory: Effort normal and breath sounds normal.  GI: Soft. He exhibits no distension. There is tenderness. There is no rebound.  Tender on the right side of the abdomen to  palpation. No rigidity noted.  Neurological: He is alert and oriented to person, place, and time.  Skin: Skin is warm and dry.     Assessment/Plan Impression: Acute appendicitis Plan: Patient be taken to the operating room for laparoscopic appendectomy. The risks and benefits of the procedure including bleeding, infection, and the possibility of an open procedure were fully explained to the patient, who gave informed consent.  JJamesetta So MD 10/11/2015, 7:16 AM

## 2015-10-11 NOTE — ED Provider Notes (Signed)
CSN: [id number]     Arrival date & time 10/10/15  2247 History   First MD Initiated Contact with Patient 10/11/15 [phone]     Chief Complaint  Patient presents with  . Abdominal Pain     (Consider location/radiation/quality/duration/timing/severity/associated sxs/prior Treatment) HPI  This is a 45 year old male who presents with a one-day history of abdominal pain. He states that it comes and goes. It is central and nonradiating. Currently his pain is 8 out of 10. He denies any urinary symptoms. He denies any fever. Onset of symptoms was 1 PM yesterday. He has not taken anything for pain. He does report nausea. No diarrhea.  History reviewed. No pertinent past medical history. History reviewed. No pertinent past surgical history. History reviewed. No pertinent family history. Social History  Substance Use Topics  . Smoking status: Never Smoker   . Smokeless tobacco: None  . Alcohol Use: Yes     Comment: some on weekends    Review of Systems  Constitutional: Negative for fever.  Respiratory: Negative for shortness of breath.   Cardiovascular: Negative for chest pain.  Gastrointestinal: Positive for nausea and abdominal pain. Negative for vomiting and diarrhea.  Genitourinary: Negative for dysuria and hematuria.  All other systems reviewed and are negative.     Allergies  Review of patient's allergies indicates no known allergies.  Home Medications   Prior to Admission medications   Not on File   BP 105/66 mmHg  Pulse 76  Temp(Src) 98.3 F (36.8 C) (Oral)  Resp 20  Ht  (1.676 m)  Wt 262 lb (118.842 kg)  BMI 42.31 kg/m2  SpO2 94% Physical Exam  Constitutional: He is oriented to person, place, and time. He appears well-developed and well-nourished.  Overweight, no acute distress  HENT:  Head: Normocephalic and atraumatic.  Cardiovascular: Normal rate, regular rhythm and normal heart sounds.   No murmur heard. Pulmonary/Chest: Effort normal and breath sounds  normal. No respiratory distress. He has no wheezes.  Abdominal: Soft. Bowel sounds are normal. There is tenderness. There is no rebound.  Tenderness to palpation right mid abdomen and the umbilical region, no rebound or guarding  Musculoskeletal: He exhibits no edema.  Neurological: He is alert and oriented to person, place, and time.  Skin: Skin is warm and dry.  Psychiatric: He has a normal mood and affect.  Nursing note and vitals reviewed.   ED Course  Procedures (including critical care time) Labs Review Labs Reviewed  COMPREHENSIVE METABOLIC PANEL - Abnormal; Notable for the following:    Sodium 134 (*)    Glucose, Bld 146 (*)    Calcium 8.5 (*)    Alkaline Phosphatase 35 (*)    All other components within normal limits  CBC - Abnormal; Notable for the following:    WBC 15.3 (*)    All other components within normal limits  URINALYSIS, ROUTINE W REFLEX MICROSCOPIC (NOT AT [facility]) - Abnormal; Notable for the following:    Color, Urine AMBER (*)    All other components within normal limits  LIPASE, BLOOD    Imaging Review No results found. I have personally reviewed and evaluated these images and lab results as part of my medical decision-making.   EKG Interpretation None      MDM   Final diagnoses:  Other acute appendicitis    Patient presents with abdominal pain and nausea. Onset of symptoms last 24 hours. He is nontoxic-appearing. Afebrile. Lab work reviewed from triage. White count of 15. He is  tender on exam without signs of peritonitis. Given that the pain is periumbilical and right-sided, appendicitis is a consideration. Patient was given pain and nausea medication. CT scan does show acute uncomplicated appendicitis. Will consult general surgery.    Shon Batonourtney F Mirielle Byrum, MD 10/11/15 [phone]

## 2015-10-11 NOTE — Op Note (Signed)
Patient:  Douglas Hayes  DOB:  1971/03/12  MRN:  [id number]   Preop Diagnosis:  Acute appendicitis  Postop Diagnosis:  Same  Procedure:  Laparoscopic appendectomy  Surgeon:  Franky MachoMark Bence Trapp, M.D.  Anes:  Gen. endotracheal  Indications:  Patient is a 45 year old Hispanic male who presents with a less than 24-hour history of worsening right-sided abdominal pain. CT scan of the abdomen reveals acute appendicitis. The risks and benefits of the procedure including bleeding, infection, and the possibility of an open procedure were fully explained to the patient, who gave informed consent.  Procedure note:  The patient was placed the supine position. After induction of general endotracheal anesthesia, the abdomen was prepped and draped using the usual sterile technique with DuraPrep. Surgical site confirmation was performed.  A supraumbilical incision was made down to the fascia. A Veress needle was introduced into the abdominal cavity and confirmation of placement was done using the saline drop test. The abdomen was then insufflated to 17 mmHg pressure. An 11 mm trocar was introduced into the abdominal cavity under direct visualization without difficulty. The patient was placed in deeper Trendelenburg position and an additional 12 mm trocar was placed the suprapubic region and a 5 mm trocar was placed left lower quadrant region. The appendix was identified and noted to be diffusely inflamed. There was no evidence of perforation. The mesial appendix was divided using the harmonic scalpel. A standard Endo GIA was placed across the base the appendix and fired. The appendix was then removed using an Endo Catch bag without difficulty. The staple line was inspected and noted to be intact. All fluid and air were then evacuated from the abdominal cavity prior to removal of the trochars.  All wounds were irrigated with normal saline. All wounds were injected with 0.5% Sensorcaine. The suprabuccal fashion  was reapproximated using an 0 Vicryl interrupted suture. All skin incisions were closed using staples. Betadine ointment and dry sterile dressings were applied.  All tape and needle counts were correct at the end of the procedure. Patient was extubated in the operating room and transferred to PACU in stable condition.  Complications:  None  EBL:  Minimal  Specimen:  Appendix

## 2015-10-11 NOTE — ED Notes (Signed)
Pt brought to OR, fully undressed, belongings in pt belonging bag.

## 2015-10-11 NOTE — Addendum Note (Signed)
Addendum  created 10/11/15 1018 by Franco Noneseresa S Crystelle Ferrufino, CRNA   Modules edited: Anesthesia Events, Anesthesia Flowsheet

## 2015-10-11 NOTE — Anesthesia Preprocedure Evaluation (Addendum)
Anesthesia Evaluation  Patient identified by MRN, date of birth, ID band Patient awake    Reviewed: Allergy & Precautions, NPO status , Patient's Chart, lab work & pertinent test results  Airway Mallampati: III  TM Distance: >3 FB Neck ROM: Full    Dental  (+) Teeth Intact, Dental Advisory Given   Pulmonary neg pulmonary ROS,    breath sounds clear to auscultation       Cardiovascular negative cardio ROS   Rhythm:Regular Rate:Normal     Neuro/Psych    GI/Hepatic negative GI ROS,   Endo/Other  Morbid obesity  Renal/GU      Musculoskeletal   Abdominal   Peds  Hematology   Anesthesia Other Findings   Reproductive/Obstetrics                            Anesthesia Physical Anesthesia Plan  ASA: II and emergent  Anesthesia Plan: General   Post-op Pain Management:    Induction: Intravenous, Rapid sequence and Cricoid pressure planned  Airway Management Planned: Oral ETT and Video Laryngoscope Planned  Additional Equipment:   Intra-op Plan:   Post-operative Plan: Extubation in OR  Informed Consent: I have reviewed the patients History and Physical, chart, labs and discussed the procedure including the risks, benefits and alternatives for the proposed anesthesia with the patient or authorized representative who has indicated his/her understanding and acceptance.     Plan Discussed with:   Anesthesia Plan Comments:        Anesthesia Quick Evaluation

## 2015-10-11 NOTE — Transfer of Care (Signed)
Immediate Anesthesia Transfer of Care Note  Patient: Douglas Hayes  Procedure(s) Performed: Procedure(s): APPENDECTOMY LAPAROSCOPIC (N/A)  Patient Location: PACU  Anesthesia Type:General  Level of Consciousness: awake and patient cooperative  Airway & Oxygen Therapy: Patient Spontanous Breathing and non-rebreather face mask  Post-op Assessment: Report given to RN, Post -op Vital signs reviewed and stable and Patient moving all extremities  Post vital signs: Reviewed and stable    Complications: No apparent anesthesia complications

## 2015-10-11 NOTE — Anesthesia Procedure Notes (Signed)
Procedure Name: Intubation Date/Time: 10/11/2015 8:18 AM Performed by: Franco NonesYATES, Taron Mondor S Pre-anesthesia Checklist: Patient identified, Emergency Drugs available, Suction available, Patient being monitored and Timeout performed Patient Re-evaluated:Patient Re-evaluated prior to inductionOxygen Delivery Method: Circle system utilized Preoxygenation: Pre-oxygenation with 100% oxygen Intubation Type: IV induction, Rapid sequence and Cricoid Pressure applied Ventilation: Mask ventilation without difficulty Laryngoscope Size: Glidescope and 3 Grade View: Grade I Tube type: Oral Tube size: 7.0 mm Number of attempts: 1 Airway Equipment and Method: Video-laryngoscopy,  Stylet and Oral airway Placement Confirmation: ETT inserted through vocal cords under direct vision,  positive ETCO2 and breath sounds checked- equal and bilateral Secured at: 22 cm Tube secured with: Tape Dental Injury: Teeth and Oropharynx as per pre-operative assessment

## 2015-10-11 NOTE — Anesthesia Postprocedure Evaluation (Signed)
Anesthesia Post Note  Patient: Douglas Hayes  Procedure(s) Performed: Procedure(s) (LRB): APPENDECTOMY LAPAROSCOPIC (N/A)  Patient location during evaluation: PACU Anesthesia Type: General Level of consciousness: awake and alert Pain management: pain level controlled Vital Signs Assessment: post-procedure vital signs reviewed and stable Respiratory status: spontaneous breathing and non-rebreather facemask Cardiovascular status: blood pressure returned to baseline and stable Anesthetic complications: no    Last Vitals:  Filed Vitals:   10/11/15 0925 10/11/15 0930  BP:  99/63  Pulse:  74  Temp: 37.5 C 37.5 C  Resp:  20    Last Pain:  Filed Vitals:   10/11/15 0939  PainSc: 0-No pain                 Carrel Leather

## 2015-10-12 ENCOUNTER — Encounter (HOSPITAL_COMMUNITY): Payer: Self-pay | Admitting: General Surgery

## 2015-10-12 LAB — CBC
HEMATOCRIT: 36.7 % — AB (ref 39.0–52.0)
HEMOGLOBIN: 12 g/dL — AB (ref 13.0–17.0)
MCH: 27.8 pg (ref 26.0–34.0)
MCHC: 32.7 g/dL (ref 30.0–36.0)
MCV: 85.2 fL (ref 78.0–100.0)
Platelets: 219 10*3/uL (ref 150–400)
RBC: 4.31 MIL/uL (ref 4.22–5.81)
RDW: 14.3 % (ref 11.5–15.5)
WBC: 13.9 10*3/uL — ABNORMAL HIGH (ref 4.0–10.5)

## 2015-10-12 LAB — BASIC METABOLIC PANEL
Anion gap: 7 (ref 5–15)
BUN: 17 mg/dL (ref 6–20)
CHLORIDE: 104 mmol/L (ref 101–111)
CO2: 27 mmol/L (ref 22–32)
Calcium: 8.1 mg/dL — ABNORMAL LOW (ref 8.9–10.3)
Creatinine, Ser: 0.83 mg/dL (ref 0.61–1.24)
GFR calc Af Amer: >60 mL/min (ref >60)
GFR calc non Af Amer: >60 mL/min (ref >60)
GLUCOSE: 120 mg/dL — AB (ref 65–99)
POTASSIUM: 3.8 mmol/L (ref 3.5–5.1)
Sodium: 138 mmol/L (ref 135–145)

## 2015-10-12 MED ORDER — OXYCODONE-ACETAMINOPHEN 7.5-325 MG PO TABS: 1.0000 | ORAL_TABLET | ORAL | Status: DC | PRN

## 2015-10-12 NOTE — Plan of Care (Signed)
Report received. Assume care of pt. Rosezella Kronick, SN [facility] 

## 2015-10-12 NOTE — Progress Notes (Signed)
IV removed, site WNL.  Pt given d/c instructions and new prescription.  Discussed new medication (when, how, and why to take), patient verbalizes understanding. Discussed post- op home care with patient, teachback completed. F/U appointment in place with Dr Lovell SheehanJenkins, pt states they will keep appointment. Pt is stable at this time, making calls to get a ride home.

## 2015-10-12 NOTE — Discharge Instructions (Signed)
Laparoscopic Appendectomy, Adult, Care After Refer to this sheet in the next few weeks. These instructions provide you with information on caring for yourself after your procedure. Your caregiver may also give you more specific instructions. Your treatment has been planned according to current medical practices, but problems sometimes occur. Call your caregiver if you have any problems or questions after your procedure. HOME CARE INSTRUCTIONS  Do not drive while taking narcotic pain medicines.  Use stool softener if you become constipated from your pain medicines.  Change your bandages (dressings) as directed.  Keep your wounds clean and dry. You may wash the wounds gently with soap and water. Gently pat the wounds dry with a clean towel.  Do not take baths, swim, or use hot tubs for 10 days, or as instructed by your caregiver.  Only take over-the-counter or prescription medicines for pain, discomfort, or fever as directed by your caregiver.  You may continue your normal diet as directed.  Do not lift more than 10 pounds (4.5 kg) or play contact sports for 3 weeks, or as directed.  Slowly increase your activity after surgery.  Take deep breaths to avoid getting a lung infection (pneumonia). SEEK MEDICAL CARE IF:  You have redness, swelling, or increasing pain in your wounds.  You have pus coming from your wounds.  You have drainage from a wound that lasts longer than 1 day.  You notice a bad smell coming from the wounds or dressing.  Your wound edges break open after stitches (sutures) have been removed.  You notice increasing pain in the shoulders (shoulder strap areas) or near your shoulder blades.  You develop dizzy episodes or fainting while standing.  You develop shortness of breath.  You develop persistent nausea or vomiting.  You cannot control your bowel functions or lose your appetite.  You develop diarrhea. SEEK IMMEDIATE MEDICAL CARE IF:   You have a  fever.  You develop a rash.  You have difficulty breathing or sharp pains in your chest.  You develop any reaction or side effects to medicines given. MAKE SURE YOU:  Understand these instructions.  Will watch your condition.  Will get help right away if you are not doing well or get worse.   This information is not intended to replace advice given to you by your health care provider. Make sure you discuss any questions you have with your health care provider.   Document Released: 07/02/2005 Document Revised: 11/16/2014 Document Reviewed: 12/20/2014 Elsevier Interactive Patient Education 2016 Elsevier [employer]. Apendicectoma laparoscpica en adultos, cuidados posteriores (Laparoscopic Appendectomy, Adult, Care After) Por favor, lea estas instrucciones y consltelas en las prximas semanas. Estas indicaciones le proporcionan informacin general acerca de cmo deber cuidarse despus de dejar el hospital. El mdico podr darle instrucciones especficas. Aunque el tratamiento se ha planificado de acuerdo con las prcticas mdicas disponibles ms recientes, ocasionalmente pueden ocurrir complicaciones inevitables. Si tiene problemas o surgen preguntas luego de recibir el alta, por favor comunquese con su mdico. INSTRUCCIONES PARA EL CUIDADO DOMICILIARIO  No conduzca mientras toma medicamentos narcticos prescriptos para Chief Technology Officerel dolor.  Si estos medicamentos lo constipan, use un laxante.  Cambie el vendaje tal como se le indic.  Mantenga la herida limpia y seca. Lave suavemente la herida con agua y [place]. Seque suavemente con pequeos golpecitos, sin frotar.  No tome baos, no utilice piscinas ni baeras durante [address] das, o segn las indicaciones del mdico.  Solo tome medicamentos que se pueden comprar sin receta o recetados para el  dolor, Dentist o fiebre, como le indica el mdico.  Puede continuar con su dieta normal segn se le haya indicado.  No levante objetos pesados (ms de 5 kg  [10 lb] ni realice deportes de contacto durante 3 semanas, o segn las indicaciones.  Despus de la operacin, podr aumentar la actividad de a poco.  Respire profundamente para evitar complicaciones del postoperatorio como neumona. SOLICITE ATENCIN MDICA SI:  Presenta enrojecimiento, hinchazn o aumento del dolor en la herida.  Observa pus en la zona de la herida.  Hay un drenaje en la herida que dura ms de Civil engineer, contracting.  Advierte un olor ftido que proviene de la herida o del vendaje.  La herida se abre (los bordes no estn unidos) luego de la remocin de las suturas.  Nota un incremento del dolor en los hombros (en la zona donde van los breteles) o cerca de los omplatos.  Presenta episodios de mareos o se siente dbil cuando est de pie.  Le falta el aire.  Presenta nuseas o vmitos persistentes.  No puede mover el vientre o tiene intolerancia a los alimentos.  Tiene diarrea. SOLICITE ATENCIN MDICA INMEDIATAMENTE SI:  Tiene fiebre.  Aparece una erupcin cutnea.  Tiene dificultad ara respirar o siente un dolor agudo en el pecho.  Aparece alguna reaccin o efecto secundario por los medicamentos administrados. ASEGRESE DE QUE:   Comprende estas instrucciones.  Controlar su enfermedad.  Solicitar ayuda de inmediato si no mejora o si empeora.   Esta informacin no tiene Theme park manager el consejo del mdico. Asegrese de hacerle al mdico cualquier pregunta que tenga.   Document Released: 07/22/2007 Document Revised: 11/16/2014 Elsevier Interactive Patient Education [employer]

## 2015-10-12 NOTE — Discharge Summary (Signed)
Physician Discharge Summary  Patient ID: Douglas SolianJuan Hayes MRN: [id number] DOB/AGE: 01-30-1971 44 y.o.  Admit date: 10/11/2015 Discharge date: 10/12/2015  Admission Diagnoses: Acute appendicitis  Discharge Diagnoses: Same Active Problems:   Acute appendicitis   Discharged Condition: good  Hospital Course: Patient is a 45 year old Hispanic male who presented emergency room with a 24-hour history of worsening right-sided abdominal pain. CT scan the abdomen revealed acute appendicitis. The patient was taken to the operating room on 10/11/2015 and underwent a laparoscopic appendectomy. He tolerated the procedure well. His postoperative course has been unremarkable. His leukocytosis has been normalizing. The patient is being discharged home on 10/12/2015 in good and improving condition.  Treatments: surgery: Laparoscopic appendectomy on 10/11/2015  Discharge Exam: Blood pressure 105/61, pulse 60, temperature 97.9 F (36.6 C), temperature source Oral, resp. rate 17, height 5\' 6"  (1.676 m), weight 118.842 kg (262 lb), SpO2 95 %. General appearance: alert, cooperative and no distress Resp: clear to auscultation bilaterally Cardio: regular rate and rhythm, S1, S2 normal, no murmur, click, rub or gallop GI: Soft, dressings dry and intact.  Disposition: Home    Medication List    TAKE these medications        JOINT HEALTH Caps  Take 1 capsule by mouth daily.     OMEGA 3 PO  Take 1 capsule by mouth daily.     oxyCODONE-acetaminophen 7.5-325 MG tablet  Commonly known as:  PERCOCET  Take 1-2 tablets by mouth every 4 (four) hours as needed.           Follow-up Information    Follow up with Dalia HeadingJENKINS,Tela Kotecki A, MD. Schedule an appointment as soon as possible for a visit on 10/18/2015.   Specialty:  General Surgery   Contact information:   [address] [place] [id number] [phone]       Signed: Franky MachoJENKINS,Andriy Sherk A 10/12/2015, 8:32 AM

## 2015-10-12 NOTE — Plan of Care (Signed)
Problem: Activity: Goal: Risk for activity intolerance will decrease Outcome: Progressing Pt states he has no walked post surgery. Pt educated about the benefit and need of walking. Pt has walked to bathroom. RN explained importance of walking tomorrow am. Lesly Dukesachel J Everett, RN

## 2015-10-12 NOTE — Plan of Care (Signed)
Problem: Activity: Goal: Risk for activity intolerance will decrease Outcome: Completed/Met Date Met:  10/12/15 Pt has ambulated around unit. Tolerated well. No distress.  Oswald Hillock, RN

## 2015-10-12 NOTE — Care Management Note (Signed)
Case Management Note  Patient Details  Name: Francella SolianJuan Ardila-Martinez MRN: [id number] Date of Birth: 10/15/1970  Subjective/Objective:                  Pt admitted for appendicitis. Pt has no PCP or insurance but has transportation.  Action/Plan: Pt plans to return home with self care. Pt has f/u appointment with surgeon. Pt given list of PCP's  In the area that will see him with no insurance. Pt can afford his Rx. No CM needs.   Expected Discharge Date:      10/12/2015            Expected Discharge Plan:  Home/Self Care  In-House Referral:  Financial Counselor  Discharge planning Services  CM Consult  Post Acute Care Choice:  NA Choice offered to:  NA  DME Arranged:    DME Agency:     HH Arranged:    HH Agency:     Status of Service:  Completed, signed off  Medicare Important Message Given:    Date Medicare IM Given:    Medicare IM give by:    Date Additional Medicare IM Given:    Additional Medicare Important Message give by:     If discussed at Long Length of Stay Meetings, dates discussed:    Additional Comments:  Malcolm MetroChildress, Lenzy Kerschner Demske, RN 10/12/2015, 10:19 AM

## 2016-06-27 ENCOUNTER — Encounter (HOSPITAL_COMMUNITY): Payer: Self-pay | Admitting: *Deleted

## 2016-12-18 ENCOUNTER — Telehealth: Payer: Self-pay | Admitting: Orthopaedic Surgery

## 2016-12-18 NOTE — Telephone Encounter (Signed)
Patient/ daughter Douglas Hayes (whom I did not find on patient's contacts) called to inquire about scheduling appointment; states has been seeing chiropractor for this problem; states also - patient would be paying cash. I relayed the general protocol regarding request for notes (referral would be recommended) from the treating provider, due to patient requesting to schedule for the same problem.  States will call back.

## 2016-12-26 ENCOUNTER — Other Ambulatory Visit: Payer: Self-pay | Admitting: Orthopedic Surgery

## 2016-12-26 DIAGNOSIS — M25561 Pain in right knee: Secondary | ICD-10-CM

## 2017-01-11 ENCOUNTER — Ambulatory Visit
Admission: RE | Admit: 2017-01-11 | Discharge: 2017-01-11 | Disposition: A | Discharge status: Home / Self Care | Payer: No Typology Code available for payment source | Source: Ambulatory Visit | Attending: Orthopedic Surgery | Admitting: Orthopedic Surgery

## 2017-01-11 DIAGNOSIS — M25561 Pain in right knee: Secondary | ICD-10-CM

## 2017-10-15 ENCOUNTER — Ambulatory Visit (HOSPITAL_COMMUNITY)
Admission: EM | Admit: 2017-10-15 | Discharge: 2017-10-15 | Disposition: A | Discharge status: Home / Self Care | Payer: Self-pay | Attending: Family Medicine | Admitting: Family Medicine

## 2017-10-15 ENCOUNTER — Encounter (HOSPITAL_COMMUNITY): Payer: Self-pay | Admitting: Emergency Medicine

## 2017-10-15 DIAGNOSIS — R1110 Vomiting, unspecified: Secondary | ICD-10-CM

## 2017-10-15 MED ORDER — ONDANSETRON 8 MG PO TBDP: 8.0000 mg | ORAL_TABLET | Freq: Three times a day (TID) | ORAL | 0 refills | Status: DC | PRN

## 2017-10-15 NOTE — ED Provider Notes (Addendum)
[facility] CARE [facility]ENTER   [id number] 10/15/17 Arrival Time: 1214   SUBJECTIVE:  [facility] Douglas Hayes is a 47 y.o. male who presents to the urgent care with complaint of vomiting and pain with vomiting starting this am; pt also notes rash to face   Patient first had any vomiting yesterday.  His most recent episode of vomiting was about 9:00 this morning.  He has no abdominal pain or diarrhea.  However patient said that he was slightly dizzy this morning when he got up, but he went to work anyway he does not feel dizzy now.  Patient does flooring    Past Medical History:  Diagnosis Date  . Hypertension    off meds since 2016  . Obesity    History reviewed. No pertinent family history. Social History   Socioeconomic History  . Marital status: Married    Spouse name: Not on file  . Number of children: Not on file  . Years of education: Not on file  . Highest education level: Not on file  Occupational History  . Not on file  Social Needs  . Financial resource strain: Not on file  . Food insecurity:    Worry: Not on file    Inability: Not on file  . Transportation needs:    Medical: Not on file    Non-medical: Not on file  Tobacco Use  . Smoking status: Never Smoker  Substance and Sexual Activity  . Alcohol use: Yes    Comment: some on weekends  . Drug use: No  . Sexual activity: Not on file  Lifestyle  . Physical activity:    Days per week: Not on file    Minutes per session: Not on file  . Stress: Not on file  Relationships  . Social connections:    Talks on phone: Not on file    Gets together: Not on file    Attends religious service: Not on file    Active member of club or organization: Not on file    Attends meetings of clubs or organizations: Not on file    Relationship status: Not on file  . Intimate partner violence:    Fear of current or ex partner: Not on file    Emotionally abused: Not on file    Physically abused: Not on file    Forced  sexual activity: Not on file  Other Topics Concern  . Not on file  Social History Narrative   ** Merged History Encounter **       No outpatient medications have been marked as taking for the 10/15/17 encounter [place] Encounter).   No Known Allergies    ROS: As per HPI, remainder of ROS negative.   OBJECTIVE:   Vitals:   10/15/17 1232  BP: (!) 145/92  Pulse: 70  Resp: 18  Temp: 98.2 F (36.8 C)  TempSrc: Oral  SpO2: 99%     General appearance: alert; no distress Eyes: PERRL; EOMI; conjunctiva normal HENT: normocephalic; atraumatic; oral mucosa normal Neck: supple Lungs: clear to auscultation bilaterally Heart: regular rate and rhythm Abdomen: soft, non-tender; bowel sounds normal; no masses or organomegaly; no guarding or rebound tenderness Back: no CVA tenderness Extremities: no cyanosis or edema; symmetrical with no gross deformities Skin: warm and dry; morbilliform facial and neck rash. Neurologic: normal gait; grossly normal Psychological: alert and cooperative; normal mood and affect      Labs:  Results for orders placed or performed during the hospital encounter of 10/11/15  Lipase, blood  Result Value Ref Range   Lipase 26 11 - 51 U/L  Comprehensive metabolic panel  Result Value Ref Range   Sodium 134 (L) 135 - 145 mmol/L   Potassium 3.8 3.5 - 5.1 mmol/L   Chloride 102 101 - 111 mmol/L   CO2 24 22 - 32 mmol/L   Glucose, Bld 146 (H) 65 - 99 mg/dL   BUN 17 6 - 20 mg/dL   Creatinine, Ser 2.950.72 0.61 - 1.24 mg/dL   Calcium 8.5 (L) 8.9 - 10.3 mg/dL   Total Protein 7.3 6.5 - 8.1 g/dL   Albumin 3.9 3.5 - 5.0 g/dL   AST 26 15 - 41 U/L   ALT 25 17 - 63 U/L   Alkaline Phosphatase 35 (L) 38 - 126 U/L   Total Bilirubin 0.6 0.3 - 1.2 mg/dL   GFR calc non Af Amer >60 >60 mL/min   GFR calc Af Amer >60 >60 mL/min   Anion gap 8 5 - 15  CBC  Result Value Ref Range   WBC 15.3 (H) 4.0 - 10.5 K/uL   RBC 5.08 4.22 - 5.81 MIL/uL   Hemoglobin 14.5 13.0 - 17.0  g/dL   HCT 62.142.7 30.839.0 - 65.752.0 %   MCV 84.1 78.0 - 100.0 fL   MCH 28.5 26.0 - 34.0 pg   MCHC 34.0 30.0 - 36.0 g/dL   RDW 84.614.1 96.211.5 - 95.215.5 %   Platelets 222 150 - 400 K/uL  Urinalysis, Routine w reflex microscopic (not at [facility])  Result Value Ref Range   Color, Urine AMBER (A) YELLOW   APPearance CLEAR CLEAR   Specific Gravity, Urine 1.010 1.005 - 1.030   pH 8.0 5.0 - 8.0   Glucose, UA NEGATIVE NEGATIVE mg/dL   Hgb urine dipstick NEGATIVE NEGATIVE   Bilirubin Urine NEGATIVE NEGATIVE   Ketones, ur NEGATIVE NEGATIVE mg/dL   Protein, ur NEGATIVE NEGATIVE mg/dL   Nitrite NEGATIVE NEGATIVE   Leukocytes, UA NEGATIVE NEGATIVE  Basic metabolic panel  Result Value Ref Range   Sodium 138 135 - 145 mmol/L   Potassium 3.8 3.5 - 5.1 mmol/L   Chloride 104 101 - 111 mmol/L   CO2 27 22 - 32 mmol/L   Glucose, Bld 120 (H) 65 - 99 mg/dL   BUN 17 6 - 20 mg/dL   Creatinine, Ser 8.410.83 0.61 - 1.24 mg/dL   Calcium 8.1 (L) 8.9 - 10.3 mg/dL   GFR calc non Af Amer >60 >60 mL/min   GFR calc Af Amer >60 >60 mL/min   Anion gap 7 5 - 15  CBC  Result Value Ref Range   WBC 13.9 (H) 4.0 - 10.5 K/uL   RBC 4.31 4.22 - 5.81 MIL/uL   Hemoglobin 12.0 (L) 13.0 - 17.0 g/dL   HCT 32.436.7 (L) 40.139.0 - 02.752.0 %   MCV 85.2 78.0 - 100.0 fL   MCH 27.8 26.0 - 34.0 pg   MCHC 32.7 30.0 - 36.0 g/dL   RDW 25.314.3 66.411.5 - 40.315.5 %   Platelets 219 150 - 400 K/uL    Labs Reviewed - No data to display  No results found.     ASSESSMENT & PLAN:  1. Intractable vomiting, presence of nausea not specified, unspecified vomiting type     Meds ordered this encounter  Medications  . ondansetron (ZOFRAN-ODT) 8 MG disintegrating tablet    Sig: Take 1 tablet (8 mg total) by mouth every 8 (eight) hours as needed for nausea.  Dispense:  12 tablet    Refill:  0    Reviewed expectations re: course of current medical issues. Questions answered. Outlined signs and symptoms indicating need for more acute intervention. Patient verbalized  understanding. After Visit Summary given.    Procedures:      Elvina Sidle, MD 10/15/17 1244    Elvina Sidle, MD 10/15/17 1245

## 2017-10-15 NOTE — ED Triage Notes (Signed)
Pt sts vomiting and pain with vomiting starting this am; pt sts rash to face

## 2018-07-24 ENCOUNTER — Emergency Department (HOSPITAL_COMMUNITY): Payer: Self-pay

## 2018-07-24 ENCOUNTER — Emergency Department (HOSPITAL_COMMUNITY)
Admission: EM | Admit: 2018-07-24 | Discharge: 2018-07-24 | Disposition: A | Discharge status: Home / Self Care | Payer: Self-pay | Attending: Emergency Medicine | Admitting: Emergency Medicine

## 2018-07-24 ENCOUNTER — Encounter (HOSPITAL_COMMUNITY): Payer: Self-pay | Admitting: Emergency Medicine

## 2018-07-24 ENCOUNTER — Other Ambulatory Visit: Payer: Self-pay

## 2018-07-24 DIAGNOSIS — R1011 Right upper quadrant pain: Secondary | ICD-10-CM | POA: Insufficient documentation

## 2018-07-24 DIAGNOSIS — I10 Essential (primary) hypertension: Secondary | ICD-10-CM | POA: Insufficient documentation

## 2018-07-24 LAB — COMPREHENSIVE METABOLIC PANEL
ALT: 67 U/L — ABNORMAL HIGH (ref 0–44)
ANION GAP: 5 (ref 5–15)
AST: 43 U/L — AB (ref 15–41)
Albumin: 3.6 g/dL (ref 3.5–5.0)
Alkaline Phosphatase: 41 U/L (ref 38–126)
BILIRUBIN TOTAL: 0.4 mg/dL (ref 0.3–1.2)
BUN: 16 mg/dL (ref 6–20)
CHLORIDE: 108 mmol/L (ref 98–111)
CO2: 23 mmol/L (ref 22–32)
Calcium: 8.1 mg/dL — ABNORMAL LOW (ref 8.9–10.3)
Creatinine, Ser: 0.68 mg/dL (ref 0.61–1.24)
GFR calc non Af Amer: >60 mL/min (ref >60)
Glucose, Bld: 145 mg/dL — ABNORMAL HIGH (ref 70–99)
Potassium: 3.9 mmol/L (ref 3.5–5.1)
Sodium: 136 mmol/L (ref 135–145)
TOTAL PROTEIN: 7.2 g/dL (ref 6.5–8.1)

## 2018-07-24 LAB — URINALYSIS, ROUTINE W REFLEX MICROSCOPIC
Bilirubin Urine: NEGATIVE
GLUCOSE, UA: NEGATIVE mg/dL
Hgb urine dipstick: NEGATIVE
KETONES UR: NEGATIVE mg/dL
Leukocytes, UA: NEGATIVE
NITRITE: NEGATIVE
PROTEIN: NEGATIVE mg/dL
Specific Gravity, Urine: 1.024 (ref 1.005–1.030)
pH: 5 (ref 5.0–8.0)

## 2018-07-24 LAB — CBC WITH DIFFERENTIAL/PLATELET
Abs Immature Granulocytes: 0.02 10*3/uL (ref 0.00–0.07)
BASOS PCT: 0 %
Basophils Absolute: 0 10*3/uL (ref 0.0–0.1)
EOS PCT: 0 %
Eosinophils Absolute: 0 10*3/uL (ref 0.0–0.5)
HEMATOCRIT: 46.2 % (ref 39.0–52.0)
Hemoglobin: 14.5 g/dL (ref 13.0–17.0)
IMMATURE GRANULOCYTES: 0 %
Lymphocytes Relative: 22 %
Lymphs Abs: 1.9 10*3/uL (ref 0.7–4.0)
MCH: 27.6 pg (ref 26.0–34.0)
MCHC: 31.4 g/dL (ref 30.0–36.0)
MCV: 88 fL (ref 80.0–100.0)
MONOS PCT: 5 %
Monocytes Absolute: 0.4 10*3/uL (ref 0.1–1.0)
Neutro Abs: 6.3 10*3/uL (ref 1.7–7.7)
Neutrophils Relative %: 73 %
Platelets: 244 10*3/uL (ref 150–400)
RBC: 5.25 MIL/uL (ref 4.22–5.81)
RDW: 14.5 % (ref 11.5–15.5)
WBC: 8.6 10*3/uL (ref 4.0–10.5)
nRBC: 0 % (ref 0.0–0.2)

## 2018-07-24 LAB — LIPASE, BLOOD: Lipase: 25 U/L (ref 11–51)

## 2018-07-24 MED ORDER — ONDANSETRON HCL 4 MG/2ML IJ SOLN
4.0000 mg | Freq: Once | INTRAMUSCULAR | Status: AC
  Administered 2018-07-24: 4 mg via INTRAVENOUS
  Filled 2018-07-24: qty 2

## 2018-07-24 MED ORDER — SODIUM CHLORIDE 0.9 % IV BOLUS
1000.0000 mL | Freq: Once | INTRAVENOUS | Status: AC
  Administered 2018-07-24: 1000 mL via INTRAVENOUS

## 2018-07-24 MED ORDER — PANTOPRAZOLE SODIUM 20 MG PO TBEC: 20.0000 mg | DELAYED_RELEASE_TABLET | Freq: Every day | ORAL | 0 refills | Status: DC

## 2018-07-24 MED ORDER — HYDROMORPHONE HCL 1 MG/ML IJ SOLN
1.0000 mg | Freq: Once | INTRAMUSCULAR | Status: AC
  Administered 2018-07-24: 1 mg via INTRAVENOUS
  Filled 2018-07-24: qty 1

## 2018-07-24 MED ORDER — TRAMADOL HCL 50 MG PO TABS: 50.0000 mg | ORAL_TABLET | Freq: Four times a day (QID) | ORAL | 0 refills | Status: DC | PRN

## 2018-07-24 MED ORDER — ONDANSETRON 4 MG PO TBDP: ORAL_TABLET | ORAL | 0 refills | Status: DC

## 2018-07-24 NOTE — ED Notes (Signed)
Returned from U/S

## 2018-07-24 NOTE — Discharge Instructions (Addendum)
Follow up with dr. Kendell Bane next week for recheck

## 2018-07-24 NOTE — ED Triage Notes (Signed)
Pt c/o of n/v/d with RUQ pain x 4 days. No fever.

## 2018-07-24 NOTE — ED Provider Notes (Signed)
[facility] EMERGENCY DEPARTMENT Provider Note   CSN: [id number] Arrival date & time: 07/24/18  9211     History   Chief Complaint Chief Complaint  Patient presents with  . Emesis    HPI [facility] Hottle Bradly Bienenstock is a 48 y.o. male.  Patient complains of epigastric abdominal pain and vomiting  The history is provided by the patient. No language interpreter was used.  Emesis  Severity:  Moderate Timing:  Intermittent Number of daily episodes:  5 Quality:  Bilious material Able to tolerate:  Liquids Progression:  Unchanged Chronicity:  New Recent urination:  Normal Relieved by:  Nothing Worsened by:  Nothing Ineffective treatments:  None tried Associated symptoms: abdominal pain   Associated symptoms: no cough, no diarrhea and no headaches     Past Medical History:  Diagnosis Date  . Hypertension    off meds since 2016  . Obesity     Patient Active Problem List   Diagnosis Date Noted  . Acute appendicitis 10/11/2015    Past Surgical History:  Procedure Laterality Date  . LAPAROSCOPIC APPENDECTOMY N/A 10/11/2015   Procedure: APPENDECTOMY LAPAROSCOPIC;  Surgeon: Franky Macho, MD;  Location: [facility] ORS;  Service: General;  Laterality: N/A;        Home Medications    Prior to Admission medications   Medication Sig Start Date End Date Taking? Authorizing Provider  hydrocortisone 2.5 % lotion Apply topically 2 (two) times daily. 02/14/15   Arnaldo Natal, MD  ibuprofen (ADVIL,MOTRIN) 800 MG tablet Take 1 tablet (800 mg total) by mouth every 8 (eight) hours as needed. 09/03/15   Domenick Gong, MD  Misc Natural Products [facility]) CAPS Take 1 capsule by mouth daily.    [provider]  Omega-3 Fatty Acids (OMEGA 3 PO) Take 1 capsule by mouth daily.    [provider]  ondansetron (ZOFRAN ODT) 4 MG disintegrating tablet 4mg  ODT q4 hours prn nausea/vomit 07/24/18   Bethann Berkshire, MD  pantoprazole (PROTONIX) 20 MG tablet Take 1 tablet (20 mg  total) by mouth daily. 07/24/18   Bethann Berkshire, MD  traMADol (ULTRAM) 50 MG tablet Take 1 tablet (50 mg total) by mouth every 6 (six) hours as needed. 07/24/18   Bethann Berkshire, MD    Family History History reviewed. No pertinent family history.  Social History Social History   Tobacco Use  . Smoking status: Never Smoker  . Smokeless tobacco: Never Used  Substance Use Topics  . Alcohol use: Yes    Comment: some on weekends  . Drug use: No     Allergies   Patient has no known allergies.   Review of Systems Review of Systems  Constitutional: Negative for appetite change and fatigue.  HENT: Negative for congestion, ear discharge and sinus pressure.   Eyes: Negative for discharge.  Respiratory: Negative for cough.   Cardiovascular: Negative for chest pain.  Gastrointestinal: Positive for abdominal pain and vomiting. Negative for diarrhea.  Genitourinary: Negative for frequency and hematuria.  Musculoskeletal: Negative for back pain.  Skin: Negative for rash.  Neurological: Negative for seizures and headaches.  Psychiatric/Behavioral: Negative for hallucinations.     Physical Exam Updated Vital Signs BP (!) 104/48   Pulse (!) 59   Temp 98.4 F (36.9 C) (Oral)   Resp 17   Ht 5\' 4"  (1.626 m)   Wt 121.6 kg   SpO2 95%   BMI 46.00 kg/m   Physical Exam Vitals signs reviewed.  Constitutional:  Appearance: He is well-developed.  HENT:     Head: Normocephalic.     Nose: Nose normal.  Eyes:     General: No scleral icterus.    Conjunctiva/sclera: Conjunctivae normal.  Neck:     Musculoskeletal: Neck supple.     Thyroid: No thyromegaly.  Cardiovascular:     Rate and Rhythm: Normal rate and regular rhythm.     Heart sounds: No murmur. No friction rub. No gallop.   Pulmonary:     Breath sounds: No stridor. No wheezing or rales.  Chest:     Chest wall: No tenderness.  Abdominal:     General: There is no distension.     Tenderness: There is abdominal  tenderness. There is no rebound.     Comments: Epigastric tenderness  Musculoskeletal: Normal range of motion.  Lymphadenopathy:     Cervical: No cervical adenopathy.  Skin:    Findings: No erythema or rash.  Neurological:     Mental Status: He is oriented to person, place, and time.     Motor: No abnormal muscle tone.     Coordination: Coordination normal.  Psychiatric:        Behavior: Behavior normal.      ED Treatments / Results  Labs (all labs ordered are listed, but only abnormal results are displayed) Labs Reviewed  COMPREHENSIVE METABOLIC PANEL - Abnormal; Notable for the following components:      Result Value   Glucose, Bld 145 (*)    Calcium 8.1 (*)    AST 43 (*)    ALT 67 (*)    All other components within normal limits  CBC WITH DIFFERENTIAL/PLATELET  LIPASE, BLOOD  URINALYSIS, ROUTINE W REFLEX MICROSCOPIC    EKG None  Radiology US Abdomen Complete  Result Date: 07/24/2018 CLINICAL DATA:  Pain.  Nausea vomiting.  Prior appendectomy. EXAM: ABDOMEN ULTRASOUND COMPLETE COMPARISON:  None. FINDINGS: Gallbladder: No gallstones or wall thickening visualized. No sonographic Murphy sign noted by sonographer. Common bile duct: Diameter: 3 mm Liver: No focal lesion identified. Within normal limits in parenchymal echogenicity. Portal vein is patent on color Doppler imaging with normal direction of blood flow towards the liver. IVC: No abnormality visualized. Pancreas: Visualized portion unremarkable. Spleen: Size and appearance within normal limits. Right Kidney: Length: 12.0 cm. Echogenicity within normal limits. No mass or hydronephrosis visualized. Left Kidney: Length: 11.9 cm. Echogenicity within normal limits. No mass or hydronephrosis visualized. Abdominal aorta: No aneurysm visualized. Other findings: Exam was very limited due to overlying bowel gas. IMPRESSION: 1. Limited exam due to overlying bowel gas. No acute abnormality. Specifically no evidence of gallstones or  biliary distention. 2. Increased echogenicity liver consistent fatty infiltration and/or hepatocellular disease. Electronically Signed   By: Maisie Fus  Register   On: 07/24/2018 11:14    Procedures Procedures (including critical care time)  Medications Ordered in ED Medications  sodium chloride 0.9 % bolus 1,000 mL (0 mLs Intravenous Stopped 07/24/18 1055)  HYDROmorphone (DILAUDID) injection 1 mg (1 mg Intravenous Given 07/24/18 0949)  ondansetron (ZOFRAN) injection 4 mg (4 mg Intravenous Given 07/24/18 0949)     Initial Impression / Assessment and Plan / ED Course  I have reviewed the triage vital signs and the nursing notes.  Pertinent labs & imaging results that were available during my care of the patient were reviewed by me and considered in my medical decision making (see chart for details).     Labs and ultrasound unremarkable.  Patient will be given protonic Zofran and  Ultram and will follow up with GI  Final Clinical Impressions(s) / ED Diagnoses   Final diagnoses:  Right upper quadrant abdominal pain    ED Discharge Orders         Ordered    pantoprazole (PROTONIX) 20 MG tablet  Daily     07/24/18 1225    ondansetron (ZOFRAN ODT) 4 MG disintegrating tablet     07/24/18 1225    traMADol (ULTRAM) 50 MG tablet  Every 6 hours PRN     07/24/18 1225           Bethann BerkshireZammit, Kelby Adell, MD 07/24/18 1227

## 2018-10-20 ENCOUNTER — Other Ambulatory Visit: Payer: Self-pay

## 2018-10-20 ENCOUNTER — Ambulatory Visit (HOSPITAL_COMMUNITY)
Admission: EM | Admit: 2018-10-20 | Discharge: 2018-10-20 | Disposition: A | Discharge status: Home / Self Care | Payer: Self-pay | Attending: Family Medicine | Admitting: Family Medicine

## 2018-10-20 ENCOUNTER — Encounter (HOSPITAL_COMMUNITY): Payer: Self-pay

## 2018-10-20 DIAGNOSIS — E119 Type 2 diabetes mellitus without complications: Secondary | ICD-10-CM

## 2018-10-20 LAB — GLUCOSE, CAPILLARY: Glucose-Capillary: 421 mg/dL — ABNORMAL HIGH (ref 70–99)

## 2018-10-20 LAB — POCT URINALYSIS DIP (DEVICE)
Bilirubin Urine: NEGATIVE
Glucose, UA: 500 mg/dL — AB
Hgb urine dipstick: NEGATIVE
Ketones, ur: 15 mg/dL — AB
Leukocytes,Ua: NEGATIVE
Nitrite: NEGATIVE
Protein, ur: NEGATIVE mg/dL
Specific Gravity, Urine: 1.02 (ref 1.005–1.030)
Urobilinogen, UA: 0.2 mg/dL (ref 0.0–1.0)
pH: 5 (ref 5.0–8.0)

## 2018-10-20 MED ORDER — METFORMIN HCL ER 750 MG PO TB24: 750.0000 mg | ORAL_TABLET | Freq: Every day | ORAL | 1 refills | Status: DC

## 2018-10-20 NOTE — Discharge Instructions (Addendum)
Take metformin once a day in the morning with breakfast Stop drinking sweetened beverages like Coke, Pepsi, juice, sweet tea Drink more water Stop smoking Reduce alcohol intake Read the attached information regarding diabetes Avoid sweets like cookies, candy, cakes Take a walk every day, get more exercise Call to get a primary care appointment within the next couple of weeks Return here or to ER if you feel worse

## 2018-10-20 NOTE — ED Provider Notes (Signed)
[facility]    CSN: [id number] Arrival date & time: 10/20/18  1347     History   Chief Complaint Chief Complaint  Patient presents with  . Increased Thirst    HPI Douglas Hayes is a 48 y.o. male.   HPI  Patient is here because of urinary frequency and increased thirst.  His daughter encouraged him to get medical care to make sure he does not have diabetes.  He has been told in years past that he had prediabetes, but he has not taken any action to prevent diabetes.  He drinks sweetened drinks like sodas, he drinks beer.  He smokes a couple cigarettes a week.  He is obese.  He does not exercise.  He is uncertain whether there is any diabetes in his family, parents or siblings.  He has never taken medicine for diabetes.  He does not have hypertension, hyperlipidemia, or family history of heart disease that he knows of. For the last 3 weeks he has been having progressively worsening thirst.  He states that he has water next to him all the time it is drinking constantly.  Even going to bed at night, he keeps water at the bedside.  He wakes up several times a night to urinate. No nausea vomiting diarrhea.  No other symptoms to report.  He states he has noticed a couple times at work that he feels vaguely lightheaded or his vision is blurred. He does not have a primary care doctor.  He has not had a checkup in some time.  Past Medical History:  Diagnosis Date  . Hypertension    off meds since 2016  . Obesity     Patient Active Problem List   Diagnosis Date Noted  . Acute appendicitis 10/11/2015    Past Surgical History:  Procedure Laterality Date  . LAPAROSCOPIC APPENDECTOMY N/A 10/11/2015   Procedure: APPENDECTOMY LAPAROSCOPIC;  Surgeon: Franky Macho, MD;  Location: [facility];  Service: General;  Laterality: N/A;       Home Medications    Prior to Admission medications   Medication Sig Start Date End Date Taking? Authorizing Provider  metFORMIN  (GLUCOPHAGE-XR) 750 MG 24 hr tablet Take 1 tablet (750 mg total) by mouth daily with breakfast. 10/20/18   Eustace Moore, MD  Misc Natural Products [facility]) CAPS Take 1 capsule by mouth daily.    [provider]  Omega-3 Fatty Acids (OMEGA 3 PO) Take 1 capsule by mouth daily.    [provider]    Family History History reviewed. No pertinent family history.  Social History Social History   Tobacco Use  . Smoking status: Never Smoker  . Smokeless tobacco: Never Used  Substance Use Topics  . Alcohol use: Yes    Comment: some on weekends  . Drug use: No     Allergies   Patient has no known allergies.   Review of Systems Review of Systems  Constitutional: Negative for chills, fever and unexpected weight change.  HENT: Negative for ear pain and sore throat.   Eyes: Negative for pain and visual disturbance.  Respiratory: Negative for cough and shortness of breath.   Cardiovascular: Negative for chest pain and palpitations.  Gastrointestinal: Negative for abdominal pain and vomiting.  Endocrine: Positive for polydipsia and polyuria.  Genitourinary: Positive for frequency. Negative for dysuria and hematuria.  Musculoskeletal: Negative for arthralgias and back pain.  Skin: Negative for color change and rash.  Neurological: Negative for seizures and syncope.  All other systems reviewed and are negative.    Physical Exam Triage Vital Signs ED Triage Vitals  Enc Vitals Group     BP 10/20/18 1404 120/78     Pulse Rate 10/20/18 1404 82     Resp 10/20/18 1404 17     Temp 10/20/18 1404 99 F (37.2 C)     Temp Source 10/20/18 1404 Oral     SpO2 10/20/18 1404 98 %     Weight --      Height --      Head Circumference --      Peak Flow --      Pain Score 10/20/18 1438 0     Pain Loc --      Pain Edu? --      Excl. in GC? --    No data found.  Updated Vital Signs BP 120/78 (BP Location: Right Arm)   Pulse 82   Temp 99 F (37.2 C) (Oral)    Resp 17   SpO2 98%      Physical Exam Constitutional:      General: He is not in acute distress.    Appearance: Normal appearance. He is well-developed. He is obese.  HENT:     Head: Normocephalic and atraumatic.     Right Ear: Tympanic membrane and ear canal normal.     Left Ear: Tympanic membrane and ear canal normal.     Mouth/Throat:     Mouth: Mucous membranes are moist.  Eyes:     Conjunctiva/sclera: Conjunctivae normal.     Pupils: Pupils are equal, round, and reactive to light.  Neck:     Musculoskeletal: Normal range of motion.  Cardiovascular:     Rate and Rhythm: Normal rate and regular rhythm.     Heart sounds: Normal heart sounds.  Pulmonary:     Effort: Pulmonary effort is normal. No respiratory distress.     Breath sounds: Normal breath sounds.  Abdominal:     General: There is no distension.     Palpations: Abdomen is soft.  Musculoskeletal: Normal range of motion.  Skin:    General: Skin is warm and dry.  Neurological:     Mental Status: He is alert.  Psychiatric:        Behavior: Behavior normal.      UC Treatments / Results  Labs (all labs ordered are listed, but only abnormal results are displayed) Labs Reviewed  GLUCOSE, CAPILLARY - Abnormal; Notable for the following components:      Result Value   Glucose-Capillary 421 (*)    All other components within normal limits  POCT URINALYSIS DIP (DEVICE) - Abnormal; Notable for the following components:   Glucose, UA 500 (*)    Ketones, ur 15 (*)    All other components within normal limits  CBG MONITORING, ED    EKG None  Radiology No results found.  Procedures Procedures (including critical care time)  Medications Ordered in UC Medications - No data to display  Initial Impression / Assessment and Plan / UC Course  I have reviewed the triage vital signs and the nursing notes.  Pertinent labs & imaging results that were available during my care of the patient were reviewed by me and  considered in my medical decision making (see chart for details).  Clinical Course as of Oct 19 1445  Mon Oct 20, 2018  1414 POCT Urinalysis, Dipstick [YN]    Clinical Course User Index [YN] Eustace Moore, MD  Reviewed with patient the basics of a diabetic diet.  We will going to start him on metformin.  I tried to impress upon him the importance of finding a primary care doctor.  I have given you a couple of numbers for people who are taking new patients.  He is advised to go to the ER or return if he starts to feel worse instead of better. Final Clinical Impressions(s) / UC Diagnoses   Final diagnoses:  New onset type 2 diabetes mellitus (HCC)     Discharge Instructions     Take metformin once a day in the morning with breakfast Stop drinking sweetened beverages like Coke, Pepsi, juice, sweet tea Drink more water Stop smoking Reduce alcohol intake Read the attached information regarding diabetes Avoid sweets like cookies, candy, cakes Take a walk every day, get more exercise Call to get a primary care appointment within the next couple of weeks Return here or to ER if you feel worse   ED Prescriptions    Medication Sig Dispense Auth. Provider   metFORMIN (GLUCOPHAGE-XR) 750 MG 24 hr tablet Take 1 tablet (750 mg total) by mouth daily with breakfast. 30 tablet Eustace Moore, MD     Controlled Substance Prescriptions [place] Controlled Substance Registry consulted? Not Applicable   Eustace Moore, MD 10/20/18 1450

## 2018-10-20 NOTE — ED Notes (Signed)
Patient verbalizes understanding of discharge instructions. Opportunity for questioning and answers were provided. Patient discharged from UCC by RN.  

## 2018-10-20 NOTE — ED Triage Notes (Signed)
Patient presents to Urgent Care with complaints of frequent urination and increased thirst since about 3 weeks ago. Patient states he was told in the past that he is pre-diabetic, does not take DM meds.

## 2018-12-31 ENCOUNTER — Emergency Department (HOSPITAL_COMMUNITY)
Admission: EM | Admit: 2018-12-31 | Discharge: 2019-01-01 | Disposition: A | Discharge status: Home / Self Care | Payer: Self-pay | Attending: Emergency Medicine | Admitting: Emergency Medicine

## 2018-12-31 ENCOUNTER — Other Ambulatory Visit: Payer: Self-pay

## 2018-12-31 ENCOUNTER — Ambulatory Visit (HOSPITAL_COMMUNITY)
Admission: EM | Admit: 2018-12-31 | Discharge: 2018-12-31 | Disposition: A | Discharge status: Other Acute Inpatient Hospital | Payer: Self-pay

## 2018-12-31 DIAGNOSIS — E1165 Type 2 diabetes mellitus with hyperglycemia: Secondary | ICD-10-CM | POA: Insufficient documentation

## 2018-12-31 DIAGNOSIS — R55 Syncope and collapse: Secondary | ICD-10-CM

## 2018-12-31 DIAGNOSIS — Z79899 Other long term (current) drug therapy: Secondary | ICD-10-CM | POA: Insufficient documentation

## 2018-12-31 DIAGNOSIS — R739 Hyperglycemia, unspecified: Secondary | ICD-10-CM

## 2018-12-31 DIAGNOSIS — I10 Essential (primary) hypertension: Secondary | ICD-10-CM | POA: Insufficient documentation

## 2018-12-31 DIAGNOSIS — Z7984 Long term (current) use of oral hypoglycemic drugs: Secondary | ICD-10-CM | POA: Insufficient documentation

## 2018-12-31 LAB — CBC
HCT: 44.9 % (ref 39.0–52.0)
Hemoglobin: 14.9 g/dL (ref 13.0–17.0)
MCH: 28.2 pg (ref 26.0–34.0)
MCHC: 33.2 g/dL (ref 30.0–36.0)
MCV: 85 fL (ref 80.0–100.0)
Platelets: 221 10*3/uL (ref 150–400)
RBC: 5.28 MIL/uL (ref 4.22–5.81)
RDW: 13.6 % (ref 11.5–15.5)
WBC: 10.7 10*3/uL — ABNORMAL HIGH (ref 4.0–10.5)
nRBC: 0 % (ref 0.0–0.2)

## 2018-12-31 LAB — URINALYSIS, ROUTINE W REFLEX MICROSCOPIC
Bacteria, UA: NONE SEEN
Bilirubin Urine: NEGATIVE
Glucose, UA: >=500 mg/dL — AB
Hgb urine dipstick: NEGATIVE
Ketones, ur: NEGATIVE mg/dL
Leukocytes,Ua: NEGATIVE
Nitrite: NEGATIVE
Protein, ur: NEGATIVE mg/dL
Specific Gravity, Urine: 1.007 (ref 1.005–1.030)
pH: 5 (ref 5.0–8.0)

## 2018-12-31 LAB — RAPID URINE DRUG SCREEN, HOSP PERFORMED
Amphetamines: NOT DETECTED
Barbiturates: NOT DETECTED
Benzodiazepines: NOT DETECTED
Cocaine: NOT DETECTED
Opiates: NOT DETECTED
Tetrahydrocannabinol: NOT DETECTED

## 2018-12-31 LAB — BASIC METABOLIC PANEL
Anion gap: 11 (ref 5–15)
BUN: 14 mg/dL (ref 6–20)
CO2: 23 mmol/L (ref 22–32)
Calcium: 9.1 mg/dL (ref 8.9–10.3)
Chloride: 98 mmol/L (ref 98–111)
Creatinine, Ser: 0.72 mg/dL (ref 0.61–1.24)
GFR calc Af Amer: >60 mL/min (ref >60)
GFR calc non Af Amer: >60 mL/min (ref >60)
Glucose, Bld: 262 mg/dL — ABNORMAL HIGH (ref 70–99)
Potassium: 4 mmol/L (ref 3.5–5.1)
Sodium: 132 mmol/L — ABNORMAL LOW (ref 135–145)

## 2018-12-31 LAB — CBG MONITORING, ED: Glucose-Capillary: 245 mg/dL — ABNORMAL HIGH (ref 70–99)

## 2018-12-31 LAB — TROPONIN I: Troponin I: <0.03 ng/mL (ref <0.03)

## 2018-12-31 NOTE — ED Triage Notes (Signed)
Pt reports while walking in [employer] 1 hour ago he experienced dizziness and palpitations with some mild SOB, had 4 episodes of emesis. Had an increase in metformin dosage 4 days ago. Reports h/a currently but dizziness has resolved.

## 2018-12-31 NOTE — Discharge Instructions (Signed)
Your work-up in the emergency department has been reassuring.  Be sure to stay well-hydrated by drinking plenty of fluids throughout the day.  You would also benefit from eating regular meals.  We advise follow-up with your primary care doctor for recheck.  You may return to the ED for any new or concerning symptoms.

## 2018-12-31 NOTE — ED Provider Notes (Signed)
[facility] Provider Note   CSN: [id number] Arrival date & time: 12/31/18  1920    History   Chief Complaint Chief Complaint  Patient presents with  . Dizziness    [facility] Chess[facility] Bradly BienenstockMartinez is a 48 y.o. male.     48 y/o male with hx of obesity, DM (on metformin), HTN (currently untreated) presents to the emergency department for an episode of near syncope.  Patient states that he was walking in [employer] around 1830 when he began to feel palpitations as well as lightheadedness and shortness of breath.  He states that he felt as though he was going to pass out and was dizzy, similar to "when you are drunk".  Left the store and got in his car to drive back to his job site when he developed nausea with 4 episodes of vomiting.  His symptoms have since subsided, though he has been experiencing an intermittent, mild, frontal headache which waxes and wanes.  He denies taking any medications for his symptoms.  Recently had his metformin dose increased 4 days ago.  He has eaten very little today with only a large meal at breakfast.  No fevers, chest pain, syncope, abdominal pain, back pain, extremity weakness, vision loss, tinnitus, leg swelling.  Denies ETOH and drug use.  The history is provided by the patient. No language interpreter was used.  Dizziness   Past Medical History:  Diagnosis Date  . Hypertension    off meds since 2016  . Obesity     Patient Active Problem List   Diagnosis Date Noted  . Acute appendicitis 10/11/2015    Past Surgical History:  Procedure Laterality Date  . LAPAROSCOPIC APPENDECTOMY N/A 10/11/2015   Procedure: APPENDECTOMY LAPAROSCOPIC;  Surgeon: Franky MachoMark Jenkins, MD;  Location: [facility];  Service: General;  Laterality: N/A;        Home Medications    Prior to Admission medications   Medication Sig Start Date End Date Taking? Authorizing Provider  metFORMIN (GLUCOPHAGE-XR) 750 MG 24 hr tablet Take 1 tablet  (750 mg total) by mouth daily with breakfast. 10/20/18   Eustace MooreNelson, Yvonne Sue, MD  Misc Natural Products [facility] CAPS Take 1 capsule by mouth daily.    [provider]  Omega-3 Fatty Acids (OMEGA 3 PO) Take 1 capsule by mouth daily.    [provider]    Family History No family history on file.  Social History Social History   Tobacco Use  . Smoking status: Never Smoker  . Smokeless tobacco: Never Used  Substance Use Topics  . Alcohol use: Yes    Comment: some on weekends  . Drug use: No     Allergies   Patient has no known allergies.   Review of Systems Review of Systems  Neurological: Positive for dizziness.  Ten systems reviewed and are negative for acute change, except as noted in the HPI.    Physical Exam Updated Vital Signs BP 122/82   Pulse 73   Temp 98.2 F (36.8 C)   Resp (!) 27   SpO2 97%   Physical Exam Vitals signs and nursing note reviewed.  Constitutional:      General: He is not in acute distress.    Appearance: He is well-developed. He is not diaphoretic.     Comments: Nontoxic appearing and in NAD  HENT:     Head: Normocephalic and atraumatic.  Eyes:     General: No scleral icterus.  Extraocular Movements: Extraocular movements intact.     Conjunctiva/sclera: Conjunctivae normal.     Pupils: Pupils are equal, round, and reactive to light.  Neck:     Musculoskeletal: Normal range of motion.  Cardiovascular:     Rate and Rhythm: Normal rate and regular rhythm.     Pulses: Normal pulses.  Pulmonary:     Effort: Pulmonary effort is normal. No respiratory distress.     Breath sounds: No stridor. No wheezing, rhonchi or rales.     Comments: Lungs CTAB Musculoskeletal: Normal range of motion.     Comments: No BLE edema.  Skin:    General: Skin is warm and dry.     Coloration: Skin is not pale.     Findings: No erythema or rash.  Neurological:     General: No focal deficit present.     Mental Status: He is alert  and oriented to person, place, and time.     Coordination: Coordination normal.     Comments: GCS 15. Moving all extremities spontaneously. No focal deficits noted.  Psychiatric:        Behavior: Behavior normal.      ED Treatments / Results  Labs (all labs ordered are listed, but only abnormal results are displayed) Labs Reviewed  BASIC METABOLIC PANEL - Abnormal; Notable for the following components:      Result Value   Sodium 132 (*)    Glucose, Bld 262 (*)    All other components within normal limits  CBC - Abnormal; Notable for the following components:   WBC 10.7 (*)    All other components within normal limits  URINALYSIS, ROUTINE W REFLEX MICROSCOPIC - Abnormal; Notable for the following components:   Color, Urine STRAW (*)    Glucose, UA >=500 (*)    All other components within normal limits  CBG MONITORING, ED - Abnormal; Notable for the following components:   Glucose-Capillary 245 (*)    All other components within normal limits  RAPID URINE DRUG SCREEN, HOSP PERFORMED  TROPONIN I    EKG EKG Interpretation  Date/Time:  Wednesday December 31 2018 19:31:31 EDT Ventricular Rate:  85 PR Interval:  168 QRS Duration: 88 QT Interval:  340 QTC Calculation: 404 R Axis:   79 Text Interpretation:  Normal sinus rhythm Cannot rule out Anterior infarct , age undetermined Abnormal ECG improved ST changes in inferior leads compared to 08/2015 anterior leads similar Confirmed by Marily MemosMesner, Jason [phone]([id number]) on 12/31/2018 11:51:51 PM   Radiology No results found.  Procedures Procedures (including critical care time)  Medications Ordered in ED Medications - No data to display   11:36 PM Patient with stable orthostatic vital signs.  He has a negative UDS.  His troponin is negative.  Given hemodynamic stability, reassuring work-up, will have the patient follow-up with his primary care doctor.  He has been encouraged to continue his metformin at 1000 mg twice daily.  Question  vasovagal episode vs acute reflux component vs anxiety driven event.  Patient stable for f/u with his PCP.   Initial Impression / Assessment and Plan / ED Course  I have reviewed the triage vital signs and the nursing notes.  Pertinent labs & imaging results that were available during my care of the patient were reviewed by me and considered in my medical decision making (see chart for details).        48 year old male presents to the emergency department for evaluation of episode consistent with vasovagal near syncope.  Symptom  onset at 1830 while walking through [employer].  He is presently asymptomatic.  States that he is feeling much better.  His work-up in the emergency department has been reassuring.  No leukocytosis or electrolyte derangements.  Troponin negative.  Denies any associated chest pain.  EKG improved compared to 2017.  No recent fevers.  Reports not having anything to eat since breakfast; wonder if this contributed to his episode tonight.  I believe he is stable for discharge and follow-up with his primary care doctor.  Return precautions discussed and provided. Patient discharged in stable condition with no unaddressed concerns.   Vitals:   12/31/18 1933 12/31/18 2218  BP: (!) 126/92 122/82  Pulse: 87 73  Resp: 16 (!) 27  Temp: 98.2 F (36.8 C)   SpO2: 98% 97%    Final Clinical Impressions(s) / ED Diagnoses   Final diagnoses:  Near syncope  Hyperglycemia    ED Discharge Orders    None       Antonietta Breach, PA-C 12/31/18 2357    Mesner, Corene Cornea, MD 01/01/19 [phone]

## 2018-12-31 NOTE — ED Notes (Signed)
Pt elected to go to ED for eval

## 2019-04-04 ENCOUNTER — Other Ambulatory Visit: Payer: Self-pay

## 2019-04-04 ENCOUNTER — Emergency Department (HOSPITAL_COMMUNITY)
Admission: EM | Admit: 2019-04-04 | Discharge: 2019-04-05 | Disposition: A | Discharge status: Home / Self Care | Payer: 59 | Attending: Emergency Medicine | Admitting: Emergency Medicine

## 2019-04-04 ENCOUNTER — Encounter (HOSPITAL_COMMUNITY): Payer: Self-pay | Admitting: Emergency Medicine

## 2019-04-04 DIAGNOSIS — E119 Type 2 diabetes mellitus without complications: Secondary | ICD-10-CM | POA: Diagnosis not present

## 2019-04-04 DIAGNOSIS — I10 Essential (primary) hypertension: Secondary | ICD-10-CM | POA: Insufficient documentation

## 2019-04-04 DIAGNOSIS — Z7984 Long term (current) use of oral hypoglycemic drugs: Secondary | ICD-10-CM | POA: Insufficient documentation

## 2019-04-04 DIAGNOSIS — T7840XA Allergy, unspecified, initial encounter: Secondary | ICD-10-CM | POA: Diagnosis not present

## 2019-04-04 DIAGNOSIS — L500 Allergic urticaria: Secondary | ICD-10-CM | POA: Diagnosis not present

## 2019-04-04 DIAGNOSIS — Z79899 Other long term (current) drug therapy: Secondary | ICD-10-CM | POA: Diagnosis not present

## 2019-04-04 DIAGNOSIS — L509 Urticaria, unspecified: Secondary | ICD-10-CM

## 2019-04-04 HISTORY — DX: Type 2 diabetes mellitus without complications: E11.9

## 2019-04-04 NOTE — ED Triage Notes (Signed)
Pt ate chinese food on Friday night and woke up around 3am Saturday morning with vomiting. Pt states he noticed hives on his arms and legs at that time. Pt took some Benadryl and fell asleep and then woke up this evening feeling like he could not breathe and that his heart was racing. Pt c/o itching all over and has some red bumps to buttocks. Pt denies difficulty swallowing at this time but is nauseated.

## 2019-04-05 DIAGNOSIS — L509 Urticaria, unspecified: Secondary | ICD-10-CM | POA: Diagnosis not present

## 2019-04-05 DIAGNOSIS — Z7984 Long term (current) use of oral hypoglycemic drugs: Secondary | ICD-10-CM | POA: Diagnosis not present

## 2019-04-05 DIAGNOSIS — T7840XA Allergy, unspecified, initial encounter: Secondary | ICD-10-CM | POA: Diagnosis not present

## 2019-04-05 DIAGNOSIS — I10 Essential (primary) hypertension: Secondary | ICD-10-CM | POA: Diagnosis not present

## 2019-04-05 DIAGNOSIS — Z79899 Other long term (current) drug therapy: Secondary | ICD-10-CM | POA: Diagnosis not present

## 2019-04-05 DIAGNOSIS — E119 Type 2 diabetes mellitus without complications: Secondary | ICD-10-CM | POA: Diagnosis not present

## 2019-04-05 MED ORDER — PREDNISONE 20 MG PO TABS: ORAL_TABLET | ORAL | 0 refills | Status: DC

## 2019-04-05 MED ORDER — DIPHENHYDRAMINE HCL 50 MG/ML IJ SOLN
25.0000 mg | Freq: Once | INTRAMUSCULAR | Status: AC
  Administered 2019-04-05: 25 mg via INTRAVENOUS
  Filled 2019-04-05: qty 1

## 2019-04-05 MED ORDER — METHYLPREDNISOLONE SODIUM SUCC 125 MG IJ SOLR
125.0000 mg | Freq: Once | INTRAMUSCULAR | Status: AC
  Administered 2019-04-05: 125 mg via INTRAVENOUS
  Filled 2019-04-05: qty 2

## 2019-04-05 MED ORDER — FAMOTIDINE IN NACL 20-0.9 MG/50ML-% IV SOLN
20.0000 mg | Freq: Once | INTRAVENOUS | Status: AC
  Administered 2019-04-05: 20 mg via INTRAVENOUS
  Filled 2019-04-05: qty 50

## 2019-04-05 MED ORDER — FAMOTIDINE 20 MG PO TABS: 20.0000 mg | ORAL_TABLET | Freq: Two times a day (BID) | ORAL | 0 refills | Status: DC

## 2019-04-05 NOTE — ED Provider Notes (Signed)
[facility] EMERGENCY DEPARTMENT Provider Note   CSN: [id number] Arrival date & time: 04/04/19  2342   Time seen 12:35 AM  History   Chief Complaint Chief Complaint  Patient presents with  . Allergic Reaction    HPI Douglas Hayes is a 48 y.o. male.     HPI patient states they ate dinner at their usual [place] restaurant the evening of the 19th and he ate food he would normally eat although he states he thought the shrimp tasted different.  About 3 AM that morning he woke up nauseated and went to the bathroom and vomited.  He had some diarrhea.  He also had diffuse itching and had a rash that look like hives.  He denies any swelling of his lips or tongue.  He denies any difficulty swallowing or breathing.  He did go to work although he itched all day.  He states his wife gave him Benadryl this evening and he went to bed however about 11 PM he felt like he was having trouble breathing.  He states if he took a big deep breath it caused a mild discomfort and he indicates like his diaphragm.  He states he is never had this problem before.  [employer]   Past Medical History:  Diagnosis Date  . Diabetes mellitus without complication ([place])   . Hypertension    off meds since 2016  . Obesity     Patient Active Problem List   Diagnosis Date Noted  . Acute appendicitis 10/11/2015    Past Surgical History:  Procedure Laterality Date  . LAPAROSCOPIC APPENDECTOMY N/A 10/11/2015   Procedure: APPENDECTOMY LAPAROSCOPIC;  Surgeon: Aviva Signs, MD;  Location: [facility];  Service: General;  Laterality: N/A;        Home Medications    Prior to Admission medications   Medication Sig Start Date End Date Taking? Authorizing Provider  metFORMIN (GLUCOPHAGE-XR) 750 MG 24 hr tablet Take 1 tablet (750 mg total) by mouth daily with breakfast. 10/20/18  Yes Raylene Everts, MD  famotidine (PEPCID) 20 MG tablet Take 1 tablet (20 mg total) by mouth 2 (two) times daily. 04/05/19    Rolland Porter, MD  Misc Natural Products [facility]) CAPS Take 1 capsule by mouth daily.    [provider]  Omega-3 Fatty Acids (OMEGA 3 PO) Take 1 capsule by mouth daily.    [provider]  predniSONE (DELTASONE) 20 MG tablet Take 3 po QD x 3d , then 2 po QD x 3d then 1 po QD x 3d 04/05/19   Rolland Porter, MD    Family History No family history on file.  Social History Social History   Tobacco Use  . Smoking status: Never Smoker  . Smokeless tobacco: Never Used  Substance Use Topics  . Alcohol use: Yes    Comment: some on weekends  . Drug use: No  employed Lives with spouse   Allergies   Patient has no known allergies.   Review of Systems Review of Systems  All other systems reviewed and are negative.    Physical Exam Updated Vital Signs BP 134/90   Pulse 84   Temp 97.9 F (36.6 C) (Oral)   Resp 18   Ht 5\' 5"  (1.651 m)   Wt 103 kg   SpO2 99%   BMI 37.77 kg/m   Physical Exam Vitals signs and nursing note reviewed.  Constitutional:      General: He is not in acute  distress.    Appearance: Normal appearance. He is well-developed. He is not ill-appearing or toxic-appearing.  HENT:     Head: Normocephalic and atraumatic.     Right Ear: External ear normal.     Left Ear: External ear normal.     Nose: Nose normal. No mucosal edema or rhinorrhea.     Mouth/Throat:     Mouth: Mucous membranes are moist.     Dentition: No dental abscesses.     Pharynx: Oropharynx is clear. No oropharyngeal exudate, posterior oropharyngeal erythema or uvula swelling.  Eyes:     Extraocular Movements: Extraocular movements intact.     Conjunctiva/sclera: Conjunctivae normal.     Pupils: Pupils are equal, round, and reactive to light.  Neck:     Musculoskeletal: Full passive range of motion without pain, normal range of motion and neck supple.  Cardiovascular:     Rate and Rhythm: Normal rate and regular rhythm.     Heart sounds: Normal heart sounds. No  murmur. No friction rub. No gallop.   Pulmonary:     Effort: Pulmonary effort is normal. No respiratory distress.     Breath sounds: Normal breath sounds. No wheezing, rhonchi or rales.  Chest:     Chest wall: No tenderness or crepitus.  Abdominal:     General: Bowel sounds are normal. There is no distension.     Palpations: Abdomen is soft.     Tenderness: There is no abdominal tenderness. There is no guarding or rebound.  Musculoskeletal: Normal range of motion.        General: No tenderness.     Comments: Moves all extremities well.   Skin:    General: Skin is warm and dry.     Coloration: Skin is not pale.     Findings: Rash present. No erythema.     Comments: Patient has diffuse small urticarial sometimes coalescing rash especially around his waistband, his inner arms, on his knee area.  Neurological:     General: No focal deficit present.     Mental Status: He is alert and oriented to person, place, and time.     Cranial Nerves: No cranial nerve deficit.  Psychiatric:        Mood and Affect: Mood normal. Mood is not anxious.        Speech: Speech normal.        Behavior: Behavior normal.        Thought Content: Thought content normal.      ED Treatments / Results  Labs (all labs ordered are listed, but only abnormal results are displayed) Labs Reviewed - No data to display  EKG None  Radiology No results found.  Procedures Procedures (including critical care time)  Medications Ordered in ED Medications  methylPREDNISolone sodium succinate (SOLU-MEDROL) 125 mg/2 mL injection 125 mg (125 mg Intravenous Given 04/05/19 0054)  famotidine (PEPCID) IVPB 20 mg premix (20 mg Intravenous New Bag/Given 04/05/19 0059)  diphenhydrAMINE (BENADRYL) injection 25 mg (25 mg Intravenous Given 04/05/19 0054)     Initial Impression / Assessment and Plan / ED Course  I have reviewed the triage vital signs and the nursing notes.  Pertinent labs & imaging results that were  available during my care of the patient were reviewed by me and considered in my medical decision making (see chart for details).       Patient was given IV Benadryl, Pepcid, and Solu-Medrol.  Recheck at 2:30 AM patient is sleeping.  When he is awakened  he states he is feeling better, the rash is better and the itching is gone.  When I examined him the rash is gone around his waistline and he has rare small urticarial lesions on his upper arms.  Final Clinical Impressions(s) / ED Diagnoses   Final diagnoses:  Urticaria  Allergic reaction, initial encounter    ED Discharge Orders         Ordered    predniSONE (DELTASONE) 20 MG tablet     04/05/19 0244    famotidine (PEPCID) 20 MG tablet  2 times daily     04/05/19 0244        OTC zyrtec, claritin, benadryl  Plan discharge  Devoria Albe, MD, Concha Pyo, MD 04/05/19 [phone]

## 2019-04-05 NOTE — Discharge Instructions (Addendum)
Stay cool, heat will make the itching and rash worse.  Take the medications as prescribed.  You can also take Zyrtec or Claritin over-the-counter once a day.  You can take Benadryl at bedtime if you have difficulty sleeping due to itching or rash.  Return to the emergency department if you feel like you are having any difficulty swallowing or breathing or if your rash gets a lot worse instead of better.  Avoid Chinese food from that restaurant.   Mantngase fresco, Air traffic controller la picazn y el sarpullido. Tome los medicamentos segn lo prescrito. Tambin puede tomar Zyrtec o Claritin sin receta una vez al da. Puede tomar Benadryl a la hora de acostarse si tiene dificultad para dormir debido a picazn o sarpullido. Regrese al departamento de emergencias si siente que tiene alguna dificultad para tragar o respirar o si su sarpullido empeora mucho en lugar de mejorar.   Evite la comida [place] de ese restaurante.

## 2019-04-05 NOTE — ED Notes (Signed)
Patient c/o knots under skin on arms, legs, but stomach. Says "itchy". Says knots come and go. C/o pressure in upper abdomen.

## 2019-04-13 ENCOUNTER — Emergency Department (HOSPITAL_COMMUNITY): Payer: 59

## 2019-04-13 ENCOUNTER — Other Ambulatory Visit: Payer: Self-pay

## 2019-04-13 ENCOUNTER — Encounter (HOSPITAL_COMMUNITY): Payer: Self-pay

## 2019-04-13 ENCOUNTER — Emergency Department (HOSPITAL_COMMUNITY)
Admission: EM | Admit: 2019-04-13 | Discharge: 2019-04-13 | Disposition: A | Discharge status: Home / Self Care | Payer: 59 | Attending: Emergency Medicine | Admitting: Emergency Medicine

## 2019-04-13 DIAGNOSIS — R51 Headache: Secondary | ICD-10-CM | POA: Insufficient documentation

## 2019-04-13 DIAGNOSIS — R202 Paresthesia of skin: Secondary | ICD-10-CM | POA: Diagnosis not present

## 2019-04-13 DIAGNOSIS — R200 Anesthesia of skin: Secondary | ICD-10-CM | POA: Diagnosis not present

## 2019-04-13 DIAGNOSIS — E119 Type 2 diabetes mellitus without complications: Secondary | ICD-10-CM | POA: Insufficient documentation

## 2019-04-13 DIAGNOSIS — R0789 Other chest pain: Secondary | ICD-10-CM | POA: Diagnosis not present

## 2019-04-13 DIAGNOSIS — D696 Thrombocytopenia, unspecified: Secondary | ICD-10-CM | POA: Diagnosis not present

## 2019-04-13 DIAGNOSIS — I10 Essential (primary) hypertension: Secondary | ICD-10-CM | POA: Diagnosis not present

## 2019-04-13 DIAGNOSIS — Z7984 Long term (current) use of oral hypoglycemic drugs: Secondary | ICD-10-CM | POA: Insufficient documentation

## 2019-04-13 DIAGNOSIS — Z79899 Other long term (current) drug therapy: Secondary | ICD-10-CM | POA: Diagnosis not present

## 2019-04-13 LAB — DIFFERENTIAL
Abs Immature Granulocytes: 0.05 10*3/uL (ref 0.00–0.07)
Basophils Absolute: 0 10*3/uL (ref 0.0–0.1)
Basophils Relative: 0 %
Eosinophils Absolute: 0 10*3/uL (ref 0.0–0.5)
Eosinophils Relative: 0 %
Immature Granulocytes: 1 %
Lymphocytes Relative: 32 %
Lymphs Abs: 2.9 10*3/uL (ref 0.7–4.0)
Monocytes Absolute: 0.5 10*3/uL (ref 0.1–1.0)
Monocytes Relative: 5 %
Neutro Abs: 5.5 10*3/uL (ref 1.7–7.7)
Neutrophils Relative %: 62 %

## 2019-04-13 LAB — PROTIME-INR
INR: 1 (ref 0.8–1.2)
Prothrombin Time: 13.3 seconds (ref 11.4–15.2)

## 2019-04-13 LAB — CBC
HCT: 48 % (ref 39.0–52.0)
Hemoglobin: 16.2 g/dL (ref 13.0–17.0)
MCH: 29.9 pg (ref 26.0–34.0)
MCHC: 33.8 g/dL (ref 30.0–36.0)
MCV: 88.7 fL (ref 80.0–100.0)
Platelets: 39 10*3/uL — ABNORMAL LOW (ref 150–400)
RBC: 5.41 MIL/uL (ref 4.22–5.81)
RDW: 14.6 % (ref 11.5–15.5)
WBC: 8.9 10*3/uL (ref 4.0–10.5)
nRBC: 0 % (ref 0.0–0.2)

## 2019-04-13 LAB — APTT: aPTT: 30 seconds (ref 24–36)

## 2019-04-13 LAB — URINALYSIS, ROUTINE W REFLEX MICROSCOPIC
Bilirubin Urine: NEGATIVE
Glucose, UA: NEGATIVE mg/dL
Hgb urine dipstick: NEGATIVE
Ketones, ur: NEGATIVE mg/dL
Leukocytes,Ua: NEGATIVE
Nitrite: NEGATIVE
Protein, ur: NEGATIVE mg/dL
Specific Gravity, Urine: 1.017 (ref 1.005–1.030)
pH: 5 (ref 5.0–8.0)

## 2019-04-13 LAB — TROPONIN I (HIGH SENSITIVITY)
Troponin I (High Sensitivity): <2 ng/L (ref <18)
Troponin I (High Sensitivity): <2 ng/L (ref <18)

## 2019-04-13 LAB — COMPREHENSIVE METABOLIC PANEL
ALT: 25 U/L (ref 0–44)
AST: 22 U/L (ref 15–41)
Albumin: 3.5 g/dL (ref 3.5–5.0)
Alkaline Phosphatase: 33 U/L — ABNORMAL LOW (ref 38–126)
Anion gap: 9 (ref 5–15)
BUN: 15 mg/dL (ref 6–20)
CO2: 25 mmol/L (ref 22–32)
Calcium: 8.6 mg/dL — ABNORMAL LOW (ref 8.9–10.3)
Chloride: 103 mmol/L (ref 98–111)
Creatinine, Ser: 0.81 mg/dL (ref 0.61–1.24)
GFR calc Af Amer: >60 mL/min (ref >60)
GFR calc non Af Amer: >60 mL/min (ref >60)
Glucose, Bld: 105 mg/dL — ABNORMAL HIGH (ref 70–99)
Potassium: 4.2 mmol/L (ref 3.5–5.1)
Sodium: 137 mmol/L (ref 135–145)
Total Bilirubin: 0.6 mg/dL (ref 0.3–1.2)
Total Protein: 6.7 g/dL (ref 6.5–8.1)

## 2019-04-13 LAB — RAPID URINE DRUG SCREEN, HOSP PERFORMED
Amphetamines: NOT DETECTED
Barbiturates: NOT DETECTED
Benzodiazepines: NOT DETECTED
Cocaine: NOT DETECTED
Opiates: NOT DETECTED
Tetrahydrocannabinol: NOT DETECTED

## 2019-04-13 LAB — ETHANOL: Alcohol, Ethyl (B): <10 mg/dL (ref <10)

## 2019-04-13 MED ORDER — IOHEXOL 350 MG/ML SOLN
80.0000 mL | Freq: Once | INTRAVENOUS | Status: AC | PRN
  Administered 2019-04-13: 16:00:00 80 mL via INTRAVENOUS

## 2019-04-13 NOTE — ED Notes (Signed)
Patient transported to X-ray 

## 2019-04-13 NOTE — Discharge Instructions (Addendum)
You were seen in the ER today for facial tingling and chest discomfort.  Your work-up in the ER was overall reassuring.   Your labs show that your platelet count was low at 39, something that should be rechecked by primary care within 3 to 5 days to ensure that this is improving, if it is not they may send you to a specialist for this (hematology/oncology). A low platelet count can cause difficulty with your blood to clot properly which means if you are injured you could bleed more freely.  This can sometimes lead to dangerous bleeding.  Generally, if you are injured, especially if there is a risk for internal bleeding (head injury, car accident, fall from a height, etc.), it is recommended that you seek immediate medical evaluation.  You may also need to return to the ED if you have a cut that does not stop bleeding with direct pressure within about 15 minutes.  Regarding your facial tingling your CT scan and MRI did not show any substantial abnormalities.  Given we are unsure the exact cause of this we would like you to follow-up with a neurologist, a specialist, within the next 3 to 5 days for reevaluation.  Regarding your chest discomfort overall your work-up was reassuring.  Please follow-up with the providers in your discharge instructions.  Return to the ER for new or worsening symptoms including but not limited to worsening pain, facial droop, speech abnormality, or legs, dizziness, passing out, trouble breathing, or any other concerns.

## 2019-04-13 NOTE — ED Notes (Signed)
Pt returned from MRI °

## 2019-04-13 NOTE — ED Provider Notes (Signed)
Douglas Hayes is a 48 y.o. male, presenting to the ED with left-sided facial tingling and intermittent left chest pressure.  Patient's complaints resolved upon my interaction with him.    HPI from [place], PA-C: "Douglas Hayes is a 48 y.o. male with a hx of DM, HTN, & obesity who presents to the ED w/ complaints of left sided facial paresthesias & L sided chest pressure that began yesterday AM upon waking up. Patient states he went to bed feeling fairly normal and woke up with paresthesias & "tired" feeling to the left side of his face as well as L sided chest pressure- these sxs have been constant since onset w/o alleviating/aggravating factors. Also has had intermittent L sided posterior headache that sometimes comes on quickly other times gradually without specific triggers or alleviating/aggravating factors. Denies visual disturbance, dizziness,  numbness/weakness/"tiredness" to extremities, nausea, vomiting, diaphoresis, dyspnea, or syncope. Denies hx of similar. Denies head injury.   Translator line utilized throughout encounter."  Past Medical History:  Diagnosis Date  . Diabetes mellitus without complication (HCC)   . Hypertension    off meds since 2016  . Obesity     Physical Exam  BP 122/81 (BP Location: Right Arm)   Pulse (!) 59   Temp 99.2 F (37.3 C) (Oral)   Resp 20   Ht 5\' 6"  (1.676 m)   Wt 99.8 kg   SpO2 96%   BMI 35.51 kg/m   Physical Exam Vitals signs and nursing note reviewed.  Constitutional:      General: He is not in acute distress.    Appearance: He is well-developed. He is not diaphoretic.  HENT:     Head: Normocephalic and atraumatic.  Eyes:     Conjunctiva/sclera: Conjunctivae normal.  Neck:     Musculoskeletal: Neck supple.  Cardiovascular:     Rate and Rhythm: Normal rate and regular rhythm.  Pulmonary:     Effort: Pulmonary effort is normal.  Skin:    General: Skin is warm and dry.     Coloration: Skin is not pale.  Neurological:      Mental Status: He is alert.     Comments: Attentive and responding normally. Normal conversational cadence. No noted slurred speech or facial droop. Appropriate and equal motor function in the extremities bilaterally.  Psychiatric:        Behavior: Behavior normal.     ED Course/Procedures     Procedures  Abnormal Labs Reviewed  CBC - Abnormal; Notable for the following components:      Result Value   Platelets 39 (*)    All other components within normal limits  COMPREHENSIVE METABOLIC PANEL - Abnormal; Notable for the following components:   Glucose, Bld 105 (*)    Calcium 8.6 (*)    Alkaline Phosphatase 33 (*)    All other components within normal limits  URINALYSIS, ROUTINE W REFLEX MICROSCOPIC - Abnormal; Notable for the following components:   Color, Urine AMBER (*)    APPearance HAZY (*)    All other components within normal limits   Ct Angio Head W/cm &/or Wo Cm  Result Date: 04/13/2019 CLINICAL DATA:  Numbness tingling paresthesia. Left facial numbness and left eye twitching EXAM: CT ANGIOGRAPHY HEAD AND NECK TECHNIQUE: Multidetector CT imaging of the head and neck was performed using the standard protocol during bolus administration of intravenous contrast. Multiplanar CT image reconstructions and MIPs were obtained to evaluate the vascular anatomy. Carotid stenosis measurements (when applicable) are obtained utilizing  NASCET criteria, using the distal internal carotid diameter as the denominator. CONTRAST:  80mL OMNIPAQUE IOHEXOL 350 MG/ML SOLN COMPARISON:  None. FINDINGS: CT HEAD FINDINGS Brain: No evidence of acute infarction, hemorrhage, hydrocephalus, extra-axial collection or mass lesion/mass effect. Vascular: Negative for hyperdense vessel Skull: Negative Sinuses: Mild mucosal edema paranasal sinuses. Orbits: Negative Review of the MIP images confirms the above findings CTA NECK FINDINGS Aortic arch: Standard branching. Imaged portion shows no evidence of aneurysm  or dissection. No significant stenosis of the major arch vessel origins. Right carotid system: Normal right carotid. Negative for atherosclerotic disease or dissection. Left carotid system: Normal left carotid. Negative for atherosclerotic disease or dissection. Vertebral arteries: Normal vertebral arteries bilaterally bilaterally. Widely patent to the basilar. Skeleton: Negative Other neck: Negative Upper chest: Lung apices clear bilaterally. Review of the MIP images confirms the above findings CTA HEAD FINDINGS Anterior circulation: Cavernous carotid widely patent without disease. Anterior and middle cerebral arteries normal bilaterally. Posterior circulation: Both vertebral arteries widely patent. PICA patent bilaterally. Basilar patent. Superior cerebellar and posterior cerebral arteries patent bilaterally Venous sinuses: Normal venous enhancement Anatomic variants: Negative for cerebral aneurysm. Review of the MIP images confirms the above findings IMPRESSION: Negative CT head Negative CTA head neck. Electronically Signed   By: Marlan Palauharles  Clark M.D.   On: 04/13/2019 16:32   Dg Chest 2 View  Result Date: 04/13/2019 CLINICAL DATA:  Patient complains of 2 days of left sided facial numbness and left eye twitching. Also having chest pressure EXAM: CHEST - 2 VIEW COMPARISON:  None. FINDINGS: Normal mediastinal contours. The heart size appears mildly enlarged. Elevation of the right hemidiaphragm. The lungs are clear. No pneumothorax or pleural effusion. No acute finding in the visualized skeleton. IMPRESSION: 1. No evidence of active disease. 2. Heart size appears mildly enlarged. 3. Elevation of the right hemidiaphragm. Electronically Signed   By: Emmaline KluverNancy  Ballantyne M.D.   On: 04/13/2019 14:13   Ct Angio Neck W And/or Wo Contrast  Result Date: 04/13/2019 CLINICAL DATA:  Numbness tingling paresthesia. Left facial numbness and left eye twitching EXAM: CT ANGIOGRAPHY HEAD AND NECK TECHNIQUE: Multidetector CT  imaging of the head and neck was performed using the standard protocol during bolus administration of intravenous contrast. Multiplanar CT image reconstructions and MIPs were obtained to evaluate the vascular anatomy. Carotid stenosis measurements (when applicable) are obtained utilizing NASCET criteria, using the distal internal carotid diameter as the denominator. CONTRAST:  80mL OMNIPAQUE IOHEXOL 350 MG/ML SOLN COMPARISON:  None. FINDINGS: CT HEAD FINDINGS Brain: No evidence of acute infarction, hemorrhage, hydrocephalus, extra-axial collection or mass lesion/mass effect. Vascular: Negative for hyperdense vessel Skull: Negative Sinuses: Mild mucosal edema paranasal sinuses. Orbits: Negative Review of the MIP images confirms the above findings CTA NECK FINDINGS Aortic arch: Standard branching. Imaged portion shows no evidence of aneurysm or dissection. No significant stenosis of the major arch vessel origins. Right carotid system: Normal right carotid. Negative for atherosclerotic disease or dissection. Left carotid system: Normal left carotid. Negative for atherosclerotic disease or dissection. Vertebral arteries: Normal vertebral arteries bilaterally bilaterally. Widely patent to the basilar. Skeleton: Negative Other neck: Negative Upper chest: Lung apices clear bilaterally. Review of the MIP images confirms the above findings CTA HEAD FINDINGS Anterior circulation: Cavernous carotid widely patent without disease. Anterior and middle cerebral arteries normal bilaterally. Posterior circulation: Both vertebral arteries widely patent. PICA patent bilaterally. Basilar patent. Superior cerebellar and posterior cerebral arteries patent bilaterally Venous sinuses: Normal venous enhancement Anatomic variants: Negative for cerebral aneurysm. Review of  the MIP images confirms the above findings IMPRESSION: Negative CT head Negative CTA head neck. Electronically Signed   By: Franchot Gallo M.D.   On: 04/13/2019 16:32    Mr Brain Wo Contrast (neuro Protocol)  Result Date: 04/13/2019 CLINICAL DATA:  Left-sided facial paresthesias and left chest pressure. Intermittent left-sided posterior headache. EXAM: MRI HEAD WITHOUT CONTRAST TECHNIQUE: Multiplanar, multiecho pulse sequences of the brain and surrounding structures were obtained without intravenous contrast. COMPARISON:  Head and neck CTA 04/13/2019 FINDINGS: Brain: There is no evidence of acute infarct, intracranial hemorrhage, mass, midline shift, or extra-axial fluid collection. The ventricles and sulci are normal. Small foci of T2 hyperintensity in the cerebral white matter bilaterally predominantly involve the subcortical white matter of the frontal lobes and are overall mild but advanced for age, nonspecific though most likely to reflect early chronic small vessel ischemic disease given the patient's vascular risk factors including diabetes. Vascular: Major intracranial vascular flow voids are preserved. Skull and upper cervical spine: Unremarkable bone marrow signal. Sinuses/Orbits: Unremarkable orbits. Paranasal sinuses and mastoid air cells are clear. Other: None. IMPRESSION: 1. No acute intracranial abnormality. 2. Mild chronic small vessel ischemic disease. Electronically Signed   By: Logan Bores M.D.   On: 04/13/2019 18:25    EKG Interpretation  Date/Time:  Monday April 13 2019 12:41:12 EDT Ventricular Rate:  62 PR Interval:    QRS Duration: 91 QT Interval:  367 QTC Calculation: 373 R Axis:   88 Text Interpretation:  Sinus rhythm RSR' in V1 or V2, right VCD or RVH ST elev, probable normal early repol pattern When compared to prior, no significant changes seen.  No STEMI Confirmed by Antony Blackbird [phone]) on 04/13/2019 1:09:43 PM      MDM    Patient care handoff report received from [facility], PA-C. Plan: Awaiting MRI.  Patient presents with left-sided facial tingling, left-sided headache, and left-sided chest discomfort. No  objective focal neurologic deficits noted today. Thrombocytopenia noted today was not previously noted, however, patient has no evidence of internal bleeding at this time.  No evidence of pancytopenia.  Lab work otherwise reassuring. CTA head and neck as well as MRI brain were without acute abnormalities. Delta troponins negative. PERC negative.  Patient has been instructed to follow-up with his PCP.  He will also follow-up with neurology. Patient was also counseled on possible bleeding risk with thrombocytopenia. The patient was given instructions for home care as well as strict and detailed return precautions. Patient voices understanding of these instructions, accepts the plan, and is comfortable with discharge.   Vitals:   04/13/19 1515 04/13/19 1530 04/13/19 1643 04/13/19 1644  BP: 112/88 116/83 122/81   Pulse: (!) 55 (!) 51 (!) 59   Resp: 19 (!) 26 20   Temp:      TempSrc:      SpO2: 98% 97% 96%   Weight:    99.8 kg  Height:    5\' 6"  (1.676 m)   Vitals:   04/13/19 1643 04/13/19 1644 04/13/19 1901 04/13/19 1926  BP: 122/81  130/85   Pulse: (!) 59  60 60  Resp: 20  (!) 23 16  Temp:    98.7 F (37.1 C)  TempSrc:    Oral  SpO2: 96%  98% 100%  Weight:  99.8 kg    Height:  5\' 6"  (1.676 m)        Layla Maw 04/13/19 1931    Tegeler, Gwenyth Allegra, MD 04/14/19 [phone]

## 2019-04-13 NOTE — ED Notes (Signed)
Pt verbalized understanding of discharge instructions and follow up care. Pt ambulatory to lobby with family. IV removed and bleeding controlled.

## 2019-04-13 NOTE — ED Triage Notes (Signed)
Patient complains of 2 days of left sided facial numbness and left eye twitching. No arm drift, ambulatory with steady gait. No slurred spech. Last know well Saturday night

## 2019-04-13 NOTE — ED Provider Notes (Signed)
[facility]   CSN: [id number] Arrival date & time: 04/13/19  1032     History   Chief Complaint Chief Complaint  Patient presents with   left sided facial numbness    HPI Douglas Hayes is a 48 y.o. male with a hx of DM, HTN, & obesity who presents to the ED w/ complaints of left sided facial paresthesias & L sided chest pressure that began yesterday AM upon waking up. Patient states he went to bed feeling fairly normal and woke up with paresthesias & "tired" feeling to the left side of his face as well as L sided chest pressure- these sxs have been constant since onset w/o alleviating/aggravating factors. Also has had intermittent L sided posterior headache that sometimes comes on quickly other times gradually without specific triggers or alleviating/aggravating factors. Denies visual disturbance, dizziness,  numbness/weakness/"tiredness" to extremities, nausea, vomiting, diaphoresis, dyspnea, or syncope. Denies hx of similar. Denies head injury.   Translator line utilized throughout Audiological scientist.      HPI  Past Medical History:  Diagnosis Date   Diabetes mellitus without complication (HCC)    Hypertension    off meds since 2016   Obesity     Patient Active Problem List   Diagnosis Date Noted   Acute appendicitis 10/11/2015    Past Surgical History:  Procedure Laterality Date   LAPAROSCOPIC APPENDECTOMY N/A 10/11/2015   Procedure: APPENDECTOMY LAPAROSCOPIC;  Surgeon: Franky Macho, MD;  Location: [facility];  Service: General;  Laterality: N/A;        Home Medications    Prior to Admission medications   Medication Sig Start Date End Date Taking? Authorizing Provider  famotidine (PEPCID) 20 MG tablet Take 1 tablet (20 mg total) by mouth 2 (two) times daily. 04/05/19   Devoria Albe, MD  metFORMIN (GLUCOPHAGE-XR) 750 MG 24 hr tablet Take 1 tablet (750 mg total) by mouth daily with breakfast. 10/20/18   Eustace Moore, MD    Misc Natural Products [facility]) CAPS Take 1 capsule by mouth daily.    [provider]  Omega-3 Fatty Acids (OMEGA 3 PO) Take 1 capsule by mouth daily.    [provider]  predniSONE (DELTASONE) 20 MG tablet Take 3 po QD x 3d , then 2 po QD x 3d then 1 po QD x 3d 04/05/19   Devoria Albe, MD    Family History No family history on file.  Social History Social History   Tobacco Use   Smoking status: Never Smoker   Smokeless tobacco: Never Used  Substance Use Topics   Alcohol use: Yes    Comment: some on weekends   Drug use: No     Allergies   Patient has no known allergies.   Review of Systems Review of Systems  Constitutional: Negative for chills and fever.  HENT: Negative for congestion, ear pain and sore throat.   Respiratory: Negative for cough and shortness of breath.   Cardiovascular: Positive for chest pain.  Gastrointestinal: Negative for abdominal pain, diarrhea, nausea and vomiting.  Genitourinary: Negative for dysuria.  Neurological: Positive for weakness ("tired left facial"), numbness (L sided facial) and headaches. Negative for dizziness, tremors and syncope.  All other systems reviewed and are negative.    Physical Exam Updated Vital Signs BP 114/86 (BP Location: Left Arm)    Pulse 69    Temp 99.2 F (37.3 C) (Oral)    Resp 16    SpO2 98%  Physical Exam Vitals signs and nursing note reviewed.  Constitutional:      General: He is not in acute distress.    Appearance: He is well-developed. He is not toxic-appearing.  HENT:     Head: Normocephalic and atraumatic.     Right Ear: Tympanic membrane, ear canal and external ear normal.     Left Ear: Tympanic membrane, ear canal and external ear normal.     Mouth/Throat:     Mouth: Mucous membranes are moist.     Comments: Uvula midline. Eyes:     General:        Right eye: No discharge.        Left eye: No discharge.     Extraocular Movements: Extraocular movements intact.      Conjunctiva/sclera: Conjunctivae normal.     Pupils: Pupils are equal, round, and reactive to light.  Neck:     Musculoskeletal: Neck supple.  Cardiovascular:     Rate and Rhythm: Normal rate and regular rhythm.     Comments: 2+ symmetric radial pulses. Pulmonary:     Effort: Pulmonary effort is normal. No respiratory distress.     Breath sounds: Normal breath sounds. No wheezing, rhonchi or rales.  Abdominal:     General: There is no distension.     Palpations: Abdomen is soft.     Tenderness: There is no abdominal tenderness.  Skin:    General: Skin is warm and dry.     Findings: No rash.  Neurological:     Mental Status: He is alert.     Comments: Alert. Clear speech. No facial droop. CNIII-XII grossly intact-with exception of patient reported decreased sensation to the left side of the face, able to identify sharp/dull. Bilateral upper and lower extremities' sensation grossly intact. 5/5 symmetric strength with grip strength and with plantar and dorsi flexion bilaterally.  . Normal finger to nose bilaterally. Negative pronator drift. Gait is steady and intact.   Psychiatric:        Behavior: Behavior normal.      ED Treatments / Results  Labs (all labs ordered are listed, but only abnormal results are displayed) Labs Reviewed  CBC - Abnormal; Notable for the following components:      Result Value   Platelets 39 (*)    All other components within normal limits  COMPREHENSIVE METABOLIC PANEL - Abnormal; Notable for the following components:   Glucose, Bld 105 (*)    Calcium 8.6 (*)    Alkaline Phosphatase 33 (*)    All other components within normal limits  URINALYSIS, ROUTINE W REFLEX MICROSCOPIC - Abnormal; Notable for the following components:   Color, Urine AMBER (*)    APPearance HAZY (*)    All other components within normal limits  ETHANOL  PROTIME-INR  APTT  DIFFERENTIAL  RAPID URINE DRUG SCREEN, HOSP PERFORMED  TROPONIN I (HIGH SENSITIVITY)  TROPONIN I  (HIGH SENSITIVITY)    EKG EKG Interpretation  Date/Time:  Monday April 13 2019 12:41:12 EDT Ventricular Rate:  62 PR Interval:    QRS Duration: 91 QT Interval:  367 QTC Calculation: 373 R Axis:   88 Text Interpretation:  Sinus rhythm RSR' in V1 or V2, right VCD or RVH ST elev, probable normal early repol pattern When compared to prior, no significant changes seen.  No STEMI Confirmed by Theda Belfast ([id number]) on 04/13/2019 1:09:43 PM   Radiology Ct Angio Head W/cm &/or Wo Cm  Result Date: 04/13/2019 CLINICAL DATA:  Numbness tingling paresthesia.  Left facial numbness and left eye twitching EXAM: CT ANGIOGRAPHY HEAD AND NECK TECHNIQUE: Multidetector CT imaging of the head and neck was performed using the standard protocol during bolus administration of intravenous contrast. Multiplanar CT image reconstructions and MIPs were obtained to evaluate the vascular anatomy. Carotid stenosis measurements (when applicable) are obtained utilizing NASCET criteria, using the distal internal carotid diameter as the denominator. CONTRAST:  95mL OMNIPAQUE IOHEXOL 350 MG/ML SOLN COMPARISON:  None. FINDINGS: CT HEAD FINDINGS Brain: No evidence of acute infarction, hemorrhage, hydrocephalus, extra-axial collection or mass lesion/mass effect. Vascular: Negative for hyperdense vessel Skull: Negative Sinuses: Mild mucosal edema paranasal sinuses. Orbits: Negative Review of the MIP images confirms the above findings CTA NECK FINDINGS Aortic arch: Standard branching. Imaged portion shows no evidence of aneurysm or dissection. No significant stenosis of the major arch vessel origins. Right carotid system: Normal right carotid. Negative for atherosclerotic disease or dissection. Left carotid system: Normal left carotid. Negative for atherosclerotic disease or dissection. Vertebral arteries: Normal vertebral arteries bilaterally bilaterally. Widely patent to the basilar. Skeleton: Negative Other neck: Negative Upper chest:  Lung apices clear bilaterally. Review of the MIP images confirms the above findings CTA HEAD FINDINGS Anterior circulation: Cavernous carotid widely patent without disease. Anterior and middle cerebral arteries normal bilaterally. Posterior circulation: Both vertebral arteries widely patent. PICA patent bilaterally. Basilar patent. Superior cerebellar and posterior cerebral arteries patent bilaterally Venous sinuses: Normal venous enhancement Anatomic variants: Negative for cerebral aneurysm. Review of the MIP images confirms the above findings IMPRESSION: Negative CT head Negative CTA head neck. Electronically Signed   By: Franchot Gallo M.D.   On: 04/13/2019 16:32   Dg Chest 2 View  Result Date: 04/13/2019 CLINICAL DATA:  Patient complains of 2 days of left sided facial numbness and left eye twitching. Also having chest pressure EXAM: CHEST - 2 VIEW COMPARISON:  None. FINDINGS: Normal mediastinal contours. The heart size appears mildly enlarged. Elevation of the right hemidiaphragm. The lungs are clear. No pneumothorax or pleural effusion. No acute finding in the visualized skeleton. IMPRESSION: 1. No evidence of active disease. 2. Heart size appears mildly enlarged. 3. Elevation of the right hemidiaphragm. Electronically Signed   By: Audie Pinto M.D.   On: 04/13/2019 14:13   Ct Angio Neck W And/or Wo Contrast  Result Date: 04/13/2019 CLINICAL DATA:  Numbness tingling paresthesia. Left facial numbness and left eye twitching EXAM: CT ANGIOGRAPHY HEAD AND NECK TECHNIQUE: Multidetector CT imaging of the head and neck was performed using the standard protocol during bolus administration of intravenous contrast. Multiplanar CT image reconstructions and MIPs were obtained to evaluate the vascular anatomy. Carotid stenosis measurements (when applicable) are obtained utilizing NASCET criteria, using the distal internal carotid diameter as the denominator. CONTRAST:  70mL OMNIPAQUE IOHEXOL 350 MG/ML SOLN  COMPARISON:  None. FINDINGS: CT HEAD FINDINGS Brain: No evidence of acute infarction, hemorrhage, hydrocephalus, extra-axial collection or mass lesion/mass effect. Vascular: Negative for hyperdense vessel Skull: Negative Sinuses: Mild mucosal edema paranasal sinuses. Orbits: Negative Review of the MIP images confirms the above findings CTA NECK FINDINGS Aortic arch: Standard branching. Imaged portion shows no evidence of aneurysm or dissection. No significant stenosis of the major arch vessel origins. Right carotid system: Normal right carotid. Negative for atherosclerotic disease or dissection. Left carotid system: Normal left carotid. Negative for atherosclerotic disease or dissection. Vertebral arteries: Normal vertebral arteries bilaterally bilaterally. Widely patent to the basilar. Skeleton: Negative Other neck: Negative Upper chest: Lung apices clear bilaterally. Review of the MIP images  confirms the above findings CTA HEAD FINDINGS Anterior circulation: Cavernous carotid widely patent without disease. Anterior and middle cerebral arteries normal bilaterally. Posterior circulation: Both vertebral arteries widely patent. PICA patent bilaterally. Basilar patent. Superior cerebellar and posterior cerebral arteries patent bilaterally Venous sinuses: Normal venous enhancement Anatomic variants: Negative for cerebral aneurysm. Review of the MIP images confirms the above findings IMPRESSION: Negative CT head Negative CTA head neck. Electronically Signed   By: Marlan Palauharles  Clark M.D.   On: 04/13/2019 16:32    Procedures Procedures (including critical care time)  Medications Ordered in ED Medications - No data to display   Initial Impression / Assessment and Plan / ED Course  I have reviewed the triage vital signs and the nursing notes.  Pertinent labs & imaging results that were available during my care of the patient were reviewed by me and considered in my medical decision making (see chart for  details).   Patient presents to the emergency department with left-sided facial paresthesias/"tiredness", intermittent headache, and left-sided chest pressure.  Patient nontoxic-appearing, no apparent distress, vitals WNL.  Patient symptoms are greater than 24 hours from onset, not code stroke appropriate.  Overall fairly benign physical exam.  He does have some subjective decrease sensation to the left side of his face, however he is able to discriminate sharp/dull, no other focal neurologic deficits.  We will proceed with labs to include troponins, CT angio head/neck, chest x-ray, & EKG.    CBC: Thrombocytopenia which is new from prior labs, will require close follow-up.  No anemia, leukopenia, or leukocytosis. CMP: Mild hypocalcemia, no significant electrolyte derangement.  LFTs and renal function are preserved Ethanol: WNL APTT/PT/INR: WNL. UDS/UA: Unremarkable Troponin: < 2 with delta.  EKG: no STEMI CXR: 1. No evidence of active disease. 2. Heart size appears mildly enlarged. 3. Elevation of the right hemidiaphragm  Regarding chest pain: EKG without STEMI, troponin without significant elevation or change with delta, doubt ACS.  Low risk Wells, PERC negative, doubt PE.  Pain is not a tearing sensation through the back, symmetric pulses, doubt thoracic aortic dissection.  Chest x-ray without pneumothorax, fluid overload, or fracture/dislocation.  Unclear etiology, overall reassuring work-up in this regard.  CTA head/neck:  Negative CT head Negative CTA head neck. No nuchal rigidity, fever, or leukocytosis to indicate meningitis.  Exam not consistent with Bell's palsy.Other than subjective decreased sensation to the left side of the face patient remains without focal neurologic deficits on exam.  We will proceed with MRI to rule out stroke. If MRI is negative and no acute change in patient status anticipate discharge home with close PCP follow-up regarding his thrombocytopenia & chest discomfort  as well as neurology follow-up regarding his paresthesias & headaches.  Discussed findings and plan of care with supervising physician Dr. Rush Landmarkegeler who is in agreement.   17:30: Patient care signed out to Harolyn RutherfordShawn Joy, PA-C at change of shift pending MRI and disposition.  Final Clinical Impressions(s) / ED Diagnoses   Final diagnoses:  Facial paresthesia  Chest pressure  Thrombocytopenia [facility](HCC)    ED Discharge Orders    None       Cherly Andersonetrucelli, Zeki Bedrosian R, PA-C 04/13/19 1741    Tegeler, Canary Brimhristopher J, MD 04/13/19 1759

## 2019-04-13 NOTE — ED Notes (Signed)
Patient transported to MRI 

## 2019-04-27 ENCOUNTER — Telehealth: Payer: Self-pay | Admitting: Hematology

## 2019-04-27 NOTE — Telephone Encounter (Signed)
Patient wife called to schedule appointment. Patient seen in ED 9/28 with instruction to follow up with Dr. Burr Medico, hematology. Gave wife appointment for 05/04/19 with Lacie. Demographic/insurance info confirmed.

## 2019-04-27 NOTE — Telephone Encounter (Signed)
Called and spoke with patient re moving appointment from 10/19 to 10/28. Patient has new date/time.

## 2019-05-04 ENCOUNTER — Encounter: Payer: 59 | Admitting: Nurse Practitioner

## 2019-05-13 ENCOUNTER — Inpatient Hospital Stay: Payer: 59

## 2019-05-13 ENCOUNTER — Encounter: Payer: Self-pay | Admitting: Nurse Practitioner

## 2019-05-13 ENCOUNTER — Other Ambulatory Visit: Payer: Self-pay | Admitting: Nurse Practitioner

## 2019-05-13 ENCOUNTER — Inpatient Hospital Stay: Payer: 59 | Attending: Nurse Practitioner | Admitting: Nurse Practitioner

## 2019-05-13 ENCOUNTER — Other Ambulatory Visit: Payer: Self-pay

## 2019-05-13 VITALS — BP 98/61 | HR 70 | Temp 98.3°F | Resp 17 | Ht 66.0 in | Wt 230.0 lb

## 2019-05-13 DIAGNOSIS — F17210 Nicotine dependence, cigarettes, uncomplicated: Secondary | ICD-10-CM | POA: Diagnosis not present

## 2019-05-13 DIAGNOSIS — D696 Thrombocytopenia, unspecified: Secondary | ICD-10-CM | POA: Insufficient documentation

## 2019-05-13 DIAGNOSIS — Z7984 Long term (current) use of oral hypoglycemic drugs: Secondary | ICD-10-CM | POA: Insufficient documentation

## 2019-05-13 DIAGNOSIS — Z79899 Other long term (current) drug therapy: Secondary | ICD-10-CM | POA: Insufficient documentation

## 2019-05-13 DIAGNOSIS — Z809 Family history of malignant neoplasm, unspecified: Secondary | ICD-10-CM | POA: Insufficient documentation

## 2019-05-13 DIAGNOSIS — I10 Essential (primary) hypertension: Secondary | ICD-10-CM | POA: Diagnosis not present

## 2019-05-13 DIAGNOSIS — E669 Obesity, unspecified: Secondary | ICD-10-CM | POA: Diagnosis not present

## 2019-05-13 DIAGNOSIS — E119 Type 2 diabetes mellitus without complications: Secondary | ICD-10-CM | POA: Insufficient documentation

## 2019-05-13 DIAGNOSIS — Z862 Personal history of diseases of the blood and blood-forming organs and certain disorders involving the immune mechanism: Secondary | ICD-10-CM

## 2019-05-13 LAB — CBC WITH DIFFERENTIAL (CANCER CENTER ONLY)
Abs Immature Granulocytes: 0.02 10*3/uL (ref 0.00–0.07)
Basophils Absolute: 0 10*3/uL (ref 0.0–0.1)
Basophils Relative: 0 %
Eosinophils Absolute: 0 10*3/uL (ref 0.0–0.5)
Eosinophils Relative: 0 %
HCT: 42.5 % (ref 39.0–52.0)
Hemoglobin: 14.2 g/dL (ref 13.0–17.0)
Immature Granulocytes: 0 %
Lymphocytes Relative: 33 %
Lymphs Abs: 2.8 10*3/uL (ref 0.7–4.0)
MCH: 28.7 pg (ref 26.0–34.0)
MCHC: 33.4 g/dL (ref 30.0–36.0)
MCV: 85.9 fL (ref 80.0–100.0)
Monocytes Absolute: 0.4 10*3/uL (ref 0.1–1.0)
Monocytes Relative: 5 %
Neutro Abs: 5.3 10*3/uL (ref 1.7–7.7)
Neutrophils Relative %: 62 %
Platelet Count: 248 10*3/uL (ref 150–400)
RBC: 4.95 MIL/uL (ref 4.22–5.81)
RDW: 14.3 % (ref 11.5–15.5)
WBC Count: 8.5 10*3/uL (ref 4.0–10.5)
nRBC: 0 % (ref 0.0–0.2)

## 2019-05-13 NOTE — Progress Notes (Addendum)
[facility]  [phone] [phone]  Clinic New consult Note   Patient Care Team: Inc, [facility] as PCP - General 05/13/2019  CHIEF COMPLAINTS/PURPOSE OF CONSULTATION:  Thrombocytopenia, referred by Harolyn Rutherford, PA-C  HISTORY OF PRESENTING ILLNESS:  Douglas Hayes 48 y.o. male is here because of low platelet count.  On 04/05/19 he was eating shrimp and developed hives, no previous history of food or shellfish allergy.  He presented to [facility] ED and was given solumedrol inj plus prednisone 20 mg taper over 3 days. He returned to the ED on 04/13/2019 for left-sided facial paresthesias, left-sided headache and left-sided chest pressure. CBC showed a platelet count of 39K, no other cytopenias.  CTA head and neck were negative.  MRI brain showed mild chronic small vessel ischemia without acute intracranial abnormality. He was discharged due to nonfocal neuro exam and negative imaging with instructions to f/u with PCP and neuro, he was also referred to hematology. His CBC was previously normal in 12/2018, he had transient mild anemia in 10/12/2015 with Hgb 12.0 during acute appendicitis. Abdominal US in 07/2018 showed normal spleen and increased echogenicity of liver consistent with fatty liver and/or hepatocellular disease. He has never been checked for Hep B, Hep C, HIV, or nutritional deficiencies. Denies recurrent infections. Denies heavy NSAID use. He is not on anticoagulant.   Socially, he is married, lives with spouse and family. His oldest daughter is here today as Nurse, learning disability. He works in [employer]. He reports long standing alcohol use starting at age 21 as a social drinker, later increased to 8 beers daily on the weekends. He quit drinking 2-3 months ago. She started smoking when he quit drinking, only 1 cigarette per day. No other drug use. He has never had colonoscopy or PSA. He has strong family history of cancer in maternal grandmother, aunt, and  cousin but does not know what type.   Today he presents with his daughter who is interpreting. He feels well. Denies current bleeding such as epistaxis, gum bleeding, hematuria, or hematochezia. He had hemorrhoids in the past that were treated. Denies easy bruising. Denies fever, chills, cough, chest pain, dyspnea, unintentional weight loss, fatigue, adenopathy, n/v or change in bowel habits, or pain.     MEDICAL HISTORY:  Past Medical History:  Diagnosis Date  . Diabetes mellitus without complication (HCC)   . Hypertension    off meds since 2016  . Obesity     SURGICAL HISTORY: Past Surgical History:  Procedure Laterality Date  . LAPAROSCOPIC APPENDECTOMY N/A 10/11/2015   Procedure: APPENDECTOMY LAPAROSCOPIC;  Surgeon: Franky Macho, MD;  Location: [facility];  Service: General;  Laterality: N/A;    SOCIAL HISTORY: Social History   Socioeconomic History  . Marital status: Married    Spouse name: Not on file  . Number of children: Not on file  . Years of education: Not on file  . Highest education level: Not on file  Occupational History  . Not on file  Social Needs  . Financial resource strain: Not on file  . Food insecurity    Worry: Not on file    Inability: Not on file  . Transportation needs    Medical: Not on file    Non-medical: Not on file  Tobacco Use  . Smoking status: Light Tobacco Smoker    Types: Cigarettes  . Smokeless tobacco: Never Used  . Tobacco comment: 1 cigarette every 1-2 weeks  Substance and Sexual Activity  . Alcohol  use: Yes    Comment: socially since age 21, daily on weekends until he quit 2-3 months ago in 02/2019   . Drug use: No  . Sexual activity: Not on file  Lifestyle  . Physical activity    Days per week: Not on file    Minutes per session: Not on file  . Stress: Not on file  Relationships  . Social Musician on phone: Not on file    Gets together: Not on file    Attends religious service: Not on file    Active member of  club or organization: Not on file    Attends meetings of clubs or organizations: Not on file    Relationship status: Not on file  . Intimate partner violence    Fear of current or ex partner: Not on file    Emotionally abused: Not on file    Physically abused: Not on file    Forced sexual activity: Not on file  Other Topics Concern  . Not on file  Social History Narrative   ** Merged History Encounter **        FAMILY HISTORY: Family History  Problem Relation Age of Onset  . Cancer Maternal Aunt   . Cancer Maternal Grandmother   . Cancer Cousin     ALLERGIES:  is allergic to shrimp [shellfish allergy].  MEDICATIONS:  Current Outpatient Medications  Medication Sig Dispense Refill  . famotidine (PEPCID) 20 MG tablet Take 1 tablet (20 mg total) by mouth 2 (two) times daily. 18 tablet 0  . metFORMIN (GLUCOPHAGE-XR) 750 MG 24 hr tablet Take 1 tablet (750 mg total) by mouth daily with breakfast. 30 tablet 1   No current facility-administered medications for this visit.     REVIEW OF SYSTEMS:   Constitutional: Denies fevers, chills or abnormal night sweats Eyes: Denies blurriness of vision, double vision or watery eyes Ears, nose, mouth, throat, and face: Denies mucositis, sore throat, epistaxis or gum bleeding Respiratory: Denies cough, dyspnea or wheezes Cardiovascular: Denies palpitation, chest discomfort or lower extremity swelling Gastrointestinal:  Denies nausea, heartburn, hematochezia, or change in bowel habits Skin: Denies abnormal skin rashes Lymphatics: Denies new lymphadenopathy or easy bruising Neurological:Denies numbness, tingling or new weaknesses Behavioral/Psych: Mood is stable, no new changes  All other systems were reviewed with the patient and are negative.  PHYSICAL EXAMINATION: ECOG PERFORMANCE STATUS: 0 - Asymptomatic  Vitals:   05/13/19 1437  BP: 98/61  Pulse: 70  Resp: 17  Temp: 98.3 F (36.8 C)  SpO2: 98%   Filed Weights   05/13/19 1437   Weight: 230 lb (104.3 kg)    GENERAL:alert, no distress and comfortable SKIN: no rash or bruising EYES: sclera clear  NECK: without mass LUNGS: clear with normal breathing effort HEART: regular rate & rhythm, no lower extremity edema ABDOMEN:abdomen soft, non-tender and normal bowel sounds. No hepatosplenomegaly  PSYCH: alert & oriented x 3 with fluent speech NEURO: no focal motor/sensory deficits  LABORATORY DATA:  I have reviewed the data as listed CBC Latest Ref Rng & Units 05/13/2019 04/13/2019 12/31/2018  WBC 4.0 - 10.5 Hayes/uL 8.5 8.9 10.7(H)  Hemoglobin 13.0 - 17.0 g/dL 16.1 09.6 04.5  Hematocrit 39.0 - 52.0 % 42.5 48.0 44.9  Platelets 150 - 400 Hayes/uL 248 39(L) 221    CMP Latest Ref Rng & Units 04/13/2019 12/31/2018 07/24/2018  Glucose 70 - 99 mg/dL 409(W) 119(J) 478(G)  BUN 6 - 20 mg/dL Creatinine  0.61 - 1.24 mg/dL 1.610.81 0.960.72 0.450.68  Sodium 135 - 145 mmol/L 137 132(L) 136  Potassium 3.5 - 5.1 mmol/L 4.2 4.0 3.9  Chloride 98 - 111 mmol/L 103 98 108  CO2 22 - 32 mmol/L 25 23 23   Calcium 8.9 - 10.3 mg/dL 4.0(J8.6(L) 9.1 8.1(X8.1(L)  Total Protein 6.5 - 8.1 g/dL 6.7 - 7.2  Total Bilirubin 0.3 - 1.2 mg/dL 0.6 - 0.4  Alkaline Phos 38 - 126 U/L 33(L) - 41  AST 15 - 41 U/L 22 - 43(H)  ALT 0 - 44 U/L 25 - 67(H)     RADIOGRAPHIC STUDIES: I have personally reviewed the radiological images as listed and agreed with the findings in the report. No results found.  ASSESSMENT & PLAN: 48 yo male with h/o alcohol use and isolated thrombocytopenia  1. H/o thrombocytopenia -We reviewed his medical record in detail. He had isolated thrombocytopenia with platelet count 39K in the ED on 04/13/19, no other cytopenias.  -Repeat CBC today is normal, PLT 248K -this could represent lab error. We do not recommend further work up at this time. -Plan to repeat CBC in 3 months.  -If he has recurrent thrombocytopenia at that time, we would then do further work up such as abdominal US to r/o liver disease  or splenomegaly, Hep B, Hep C, HIV to r/o viral etiology, and B12 to r/o nutritional deficiency.  -He does report long term alcohol use. We reviewed alcoholism can lead to liver disease such as cirrhosis that is a direct cause of thrombocytopenia and potential other serious complications such as liver failure and hepatocellular carcinoma. He quit 2 months ago and seems motivated to abstain  -We will call him with CBC result in 3 months with our recommendation -f/u in 6 months   2. Routine health maintenance  -he has strong maternal family history of cancer, but does not know what type -He has not had routine medical care until recently  -He has not had PSA or colonoscopy -I recommend to continue close f/u for routine health maintenance, age-appropriate cancer screenings, DM management, and healthy lifestyle.  -patient agrees  PLAN: -medical record reviewed -repeat CBC is normal today, no further work up -repeat CBC in 3 and 6 months -f/u in 6 months  -continue f/u with PCP at [facility]   Orders Placed This Encounter  Procedures  . CBC with Differential (Cancer Center Only)    Standing Status:   Standing    Number of Occurrences:   10    Standing Expiration Date:   05/12/2020    All questions were answered. The patient knows to call the clinic with any problems, questions or concerns.     Douglas Hayes , NP 05/13/19   Addendum  I have seen the patient, examined him. I agree with the assessment and and plan and have edited the notes.   48 yo Spanish-speaking male, with past medical history of poorly controlled diabetes, heavy alcohol drinker, frequent flier to ED, was incidentally found to have platelet count of 39K on his recent ED visit a month ago for left side neurological symptoms.  Work-up was negative.  He denies any easy bruising or mucosal bleeding.  Repeated CBC was normal today.  I am not sure if his thrombocytopenia was a lab error, vs  thrombocytopenia related to alcohol, or ITP.  I recommend monitor his CBC, and hold on any other work up for now. He is agreeable. We also discussed alcohol abstain, and  the better follow-up with his family doctor.  His PCP is 45 mins away, and not convenient for him, I recommend him to find a new PCP in [place].  We also discussed healthy diet, and exercise, weight loss, to better control his diabetes. He voiced good understanding.   If his repeated CBC remains to be normal in the next 3 to 6 months, I will see him back only as needed in the future.  He knows to call my office if he notices any signs of bleeding.  Truitt Merle  05/13/2019

## 2019-05-13 NOTE — Patient Instructions (Signed)
Steps to Quit Smoking Smoking tobacco is the leading cause of preventable death. It can affect almost every organ in the body. Smoking puts you and people around you at risk for many serious, long-lasting (chronic) diseases. Quitting smoking can be hard, but it is one of the best things that you can do for your health. It is never too late to quit. How do I get ready to quit? When you decide to quit smoking, make a plan to help you succeed. Before you quit:  Pick a date to quit. Set a date within the next 2 weeks to give you time to prepare.  Write down the reasons why you are quitting. Keep this list in places where you will see it often.  Tell your family, friends, and co-workers that you are quitting. Their support is important.  Talk with your doctor about the choices that may help you quit.  Find out if your health insurance will pay for these treatments.  Know the people, places, things, and activities that make you want to smoke (triggers). Avoid them. What first steps can I take to quit smoking?  Throw away all cigarettes at home, at work, and in your car.  Throw away the things that you use when you smoke, such as ashtrays and lighters.  Clean your car. Make sure to empty the ashtray.  Clean your home, including curtains and carpets. What can I do to help me quit smoking? Talk with your doctor about taking medicines and seeing a counselor at the same time. You are more likely to succeed when you do both.  If you are pregnant or breastfeeding, talk with your doctor about counseling or other ways to quit smoking. Do not take medicine to help you quit smoking unless your doctor tells you to do so. To quit smoking: Quit right away  Quit smoking totally, instead of slowly cutting back on how much you smoke over a period of time.  Go to counseling. You are more likely to quit if you go to counseling sessions regularly. Take medicine You may take medicines to help you quit. Some  medicines need a prescription, and some you can buy over-the-counter. Some medicines may contain a drug called nicotine to replace the nicotine in cigarettes. Medicines may:  Help you to stop having the desire to smoke (cravings).  Help to stop the problems that come when you stop smoking (withdrawal symptoms). Your doctor may ask you to use:  Nicotine patches, gum, or lozenges.  Nicotine inhalers or sprays.  Non-nicotine medicine that is taken by mouth. Find resources Find resources and other ways to help you quit smoking and remain smoke-free after you quit. These resources are most helpful when you use them often. They include:  Online chats with a counselor.  Phone quitlines.  Printed self-help materials.  Support groups or group counseling.  Text messaging programs.  Mobile phone apps. Use apps on your mobile phone or tablet that can help you stick to your quit plan. There are many free apps for mobile phones and tablets as well as websites. Examples include Quit Guide from the CDC and smokefree.gov  What things can I do to make it easier to quit?   Talk to your family and friends. Ask them to support and encourage you.  Call a phone quitline (1-800-QUIT-NOW), reach out to support groups, or work with a counselor.  Ask people who smoke to not smoke around you.  Avoid places that make you want to smoke,   such as: ? Bars. ? Parties. ? Smoke-break areas at work.  Spend time with people who do not smoke.  Lower the stress in your life. Stress can make you want to smoke. Try these things to help your stress: ? Getting regular exercise. ? Doing deep-breathing exercises. ? Doing yoga. ? Meditating. ? Doing a body scan. To do this, close your eyes, focus on one area of your body at a time from head to toe. Notice which parts of your body are tense. Try to relax the muscles in those areas. How will I feel when I quit smoking? Day 1 to 3 weeks Within the first 24 hours,  you may start to have some problems that come from quitting tobacco. These problems are very bad 2-3 days after you quit, but they do not often last for more than 2-3 weeks. You may get these symptoms:  Mood swings.  Feeling restless, nervous, angry, or annoyed.  Trouble concentrating.  Dizziness.  Strong desire for high-sugar foods and nicotine.  Weight gain.  Trouble pooping (constipation).  Feeling like you may vomit (nausea).  Coughing or a sore throat.  Changes in how the medicines that you take for other issues work in your body.  Depression.  Trouble sleeping (insomnia). Week 3 and afterward After the first 2-3 weeks of quitting, you may start to notice more positive results, such as:  Better sense of smell and taste.  Less coughing and sore throat.  Slower heart rate.  Lower blood pressure.  Clearer skin.  Better breathing.  Fewer sick days. Quitting smoking can be hard. Do not give up if you fail the first time. Some people need to try a few times before they succeed. Do your best to stick to your quit plan, and talk with your doctor if you have any questions or concerns. Summary  Smoking tobacco is the leading cause of preventable death. Quitting smoking can be hard, but it is one of the best things that you can do for your health.  When you decide to quit smoking, make a plan to help you succeed.  Quit smoking right away, not slowly over a period of time.  When you start quitting, seek help from your doctor, family, or friends. This information is not intended to replace advice given to you by your health care provider. Make sure you discuss any questions you have with your health care provider. Document Released: 04/28/2009 Document Revised: 09/19/2018 Document Reviewed: 09/20/2018 Elsevier Patient Education  2020 [employer] Inc.  

## 2019-05-14 ENCOUNTER — Telehealth: Payer: Self-pay | Admitting: Nurse Practitioner

## 2019-05-14 NOTE — Telephone Encounter (Signed)
I talk with patient regarding schedule  

## 2019-06-19 ENCOUNTER — Other Ambulatory Visit: Payer: Self-pay

## 2019-06-19 DIAGNOSIS — Z20822 Contact with and (suspected) exposure to covid-19: Secondary | ICD-10-CM

## 2019-06-22 ENCOUNTER — Telehealth: Payer: Self-pay | Admitting: *Deleted

## 2019-06-22 NOTE — Telephone Encounter (Signed)
Patient called to ask if results are back yet, advised they have not resulted and to try back at a later time today or tomorrow.

## 2019-06-22 NOTE — Telephone Encounter (Signed)
This encounter was created in error - please disregard.

## 2019-06-22 NOTE — Telephone Encounter (Signed)
Patient calling to see if COVID test results have returned- advised still pending- offered MyChart- activation code- patient declines.

## 2019-06-23 LAB — NOVEL CORONAVIRUS, NAA: SARS-CoV-2, NAA: DETECTED — AB

## 2019-06-24 ENCOUNTER — Telehealth: Payer: Self-pay | Admitting: Unknown Physician Specialty

## 2019-06-24 NOTE — Telephone Encounter (Signed)
Pt with symptoms for 5 days but better.  Discussed with patient about Covid symptoms and the use of bamlanivimab, a monoclonal antibody infusion for those with mild to moderate Covid symptoms and at a high risk of hospitalization.  Pt is qualified for this infusion at the [facility] infusion center due to Diabetes which were addressed with the patient and are actively being managed by [facility].    Pt is feeling better but asked to call back if he is more symptomatic

## 2019-07-20 DIAGNOSIS — Z23 Encounter for immunization: Secondary | ICD-10-CM | POA: Diagnosis not present

## 2019-07-20 DIAGNOSIS — E119 Type 2 diabetes mellitus without complications: Secondary | ICD-10-CM | POA: Diagnosis not present

## 2019-08-13 ENCOUNTER — Inpatient Hospital Stay: Payer: 59 | Attending: Nurse Practitioner

## 2019-08-13 ENCOUNTER — Other Ambulatory Visit: Payer: Self-pay

## 2019-08-13 DIAGNOSIS — Z79899 Other long term (current) drug therapy: Secondary | ICD-10-CM | POA: Insufficient documentation

## 2019-08-13 DIAGNOSIS — Z809 Family history of malignant neoplasm, unspecified: Secondary | ICD-10-CM | POA: Insufficient documentation

## 2019-08-13 DIAGNOSIS — D696 Thrombocytopenia, unspecified: Secondary | ICD-10-CM | POA: Diagnosis not present

## 2019-08-13 DIAGNOSIS — I10 Essential (primary) hypertension: Secondary | ICD-10-CM | POA: Insufficient documentation

## 2019-08-13 DIAGNOSIS — E119 Type 2 diabetes mellitus without complications: Secondary | ICD-10-CM | POA: Insufficient documentation

## 2019-08-13 DIAGNOSIS — E669 Obesity, unspecified: Secondary | ICD-10-CM | POA: Diagnosis not present

## 2019-08-13 DIAGNOSIS — Z862 Personal history of diseases of the blood and blood-forming organs and certain disorders involving the immune mechanism: Secondary | ICD-10-CM

## 2019-08-13 DIAGNOSIS — F17210 Nicotine dependence, cigarettes, uncomplicated: Secondary | ICD-10-CM | POA: Insufficient documentation

## 2019-08-13 LAB — CBC WITH DIFFERENTIAL (CANCER CENTER ONLY)
Abs Immature Granulocytes: 0.02 10*3/uL (ref 0.00–0.07)
Basophils Absolute: 0 10*3/uL (ref 0.0–0.1)
Basophils Relative: 0 %
Eosinophils Absolute: 0 10*3/uL (ref 0.0–0.5)
Eosinophils Relative: 0 %
HCT: 45.3 % (ref 39.0–52.0)
Hemoglobin: 15 g/dL (ref 13.0–17.0)
Immature Granulocytes: 0 %
Lymphocytes Relative: 31 %
Lymphs Abs: 2.4 10*3/uL (ref 0.7–4.0)
MCH: 28.6 pg (ref 26.0–34.0)
MCHC: 33.1 g/dL (ref 30.0–36.0)
MCV: 86.5 fL (ref 80.0–100.0)
Monocytes Absolute: 0.4 10*3/uL (ref 0.1–1.0)
Monocytes Relative: 5 %
Neutro Abs: 4.9 10*3/uL (ref 1.7–7.7)
Neutrophils Relative %: 64 %
Platelet Count: 233 10*3/uL (ref 150–400)
RBC: 5.24 MIL/uL (ref 4.22–5.81)
RDW: 14.7 % (ref 11.5–15.5)
WBC Count: 7.7 10*3/uL (ref 4.0–10.5)
nRBC: 0 % (ref 0.0–0.2)

## 2019-08-17 ENCOUNTER — Telehealth: Payer: Self-pay | Admitting: *Deleted

## 2019-08-17 NOTE — Telephone Encounter (Signed)
Per Santiago Glad, NP, called to notify pt of normal CBC levels and platelets are in normal range. Reviewed pt appts with him and advised to call office if there is any significant bleeding. Pt verbalized understanding.

## 2019-11-04 ENCOUNTER — Other Ambulatory Visit: Payer: Self-pay

## 2019-11-04 ENCOUNTER — Emergency Department (HOSPITAL_COMMUNITY)
Admission: EM | Admit: 2019-11-04 | Discharge: 2019-11-04 | Disposition: A | Discharge status: Home / Self Care | Payer: 59 | Attending: Emergency Medicine | Admitting: Emergency Medicine

## 2019-11-04 ENCOUNTER — Encounter (HOSPITAL_COMMUNITY): Payer: Self-pay | Admitting: Emergency Medicine

## 2019-11-04 ENCOUNTER — Emergency Department (HOSPITAL_COMMUNITY): Payer: 59

## 2019-11-04 DIAGNOSIS — R0981 Nasal congestion: Secondary | ICD-10-CM | POA: Diagnosis not present

## 2019-11-04 DIAGNOSIS — R05 Cough: Secondary | ICD-10-CM | POA: Insufficient documentation

## 2019-11-04 DIAGNOSIS — Z720 Tobacco use: Secondary | ICD-10-CM | POA: Insufficient documentation

## 2019-11-04 DIAGNOSIS — U071 COVID-19: Secondary | ICD-10-CM | POA: Insufficient documentation

## 2019-11-04 DIAGNOSIS — R0602 Shortness of breath: Secondary | ICD-10-CM | POA: Insufficient documentation

## 2019-11-04 DIAGNOSIS — R059 Cough, unspecified: Secondary | ICD-10-CM

## 2019-11-04 DIAGNOSIS — R112 Nausea with vomiting, unspecified: Secondary | ICD-10-CM | POA: Diagnosis not present

## 2019-11-04 DIAGNOSIS — R197 Diarrhea, unspecified: Secondary | ICD-10-CM

## 2019-11-04 DIAGNOSIS — I10 Essential (primary) hypertension: Secondary | ICD-10-CM | POA: Diagnosis not present

## 2019-11-04 DIAGNOSIS — E119 Type 2 diabetes mellitus without complications: Secondary | ICD-10-CM | POA: Insufficient documentation

## 2019-11-04 DIAGNOSIS — R079 Chest pain, unspecified: Secondary | ICD-10-CM | POA: Diagnosis not present

## 2019-11-04 LAB — BASIC METABOLIC PANEL
Anion gap: 11 (ref 5–15)
BUN: 14 mg/dL (ref 6–20)
CO2: 24 mmol/L (ref 22–32)
Calcium: 9.1 mg/dL (ref 8.9–10.3)
Chloride: 103 mmol/L (ref 98–111)
Creatinine, Ser: 1.07 mg/dL (ref 0.61–1.24)
GFR calc Af Amer: >60 mL/min (ref >60)
GFR calc non Af Amer: >60 mL/min (ref >60)
Glucose, Bld: 124 mg/dL — ABNORMAL HIGH (ref 70–99)
Potassium: 4.3 mmol/L (ref 3.5–5.1)
Sodium: 138 mmol/L (ref 135–145)

## 2019-11-04 LAB — CBC
HCT: 46.4 % (ref 39.0–52.0)
Hemoglobin: 15.2 g/dL (ref 13.0–17.0)
MCH: 28.1 pg (ref 26.0–34.0)
MCHC: 32.8 g/dL (ref 30.0–36.0)
MCV: 85.9 fL (ref 80.0–100.0)
Platelets: 238 10*3/uL (ref 150–400)
RBC: 5.4 MIL/uL (ref 4.22–5.81)
RDW: 13.9 % (ref 11.5–15.5)
WBC: 9.6 10*3/uL (ref 4.0–10.5)
nRBC: 0 % (ref 0.0–0.2)

## 2019-11-04 LAB — HEPATIC FUNCTION PANEL
ALT: 31 U/L (ref 0–44)
AST: 45 U/L — ABNORMAL HIGH (ref 15–41)
Albumin: 3.8 g/dL (ref 3.5–5.0)
Alkaline Phosphatase: 41 U/L (ref 38–126)
Bilirubin, Direct: 0.2 mg/dL (ref 0.0–0.2)
Indirect Bilirubin: 0.6 mg/dL (ref 0.3–0.9)
Total Bilirubin: 0.8 mg/dL (ref 0.3–1.2)
Total Protein: 7.3 g/dL (ref 6.5–8.1)

## 2019-11-04 LAB — TROPONIN I (HIGH SENSITIVITY): Troponin I (High Sensitivity): 3 ng/L (ref <18)

## 2019-11-04 LAB — SARS CORONAVIRUS 2 (TAT 6-24 HRS): SARS Coronavirus 2: POSITIVE — AB

## 2019-11-04 LAB — LIPASE, BLOOD: Lipase: 27 U/L (ref 11–51)

## 2019-11-04 MED ORDER — ONDANSETRON 4 MG PO TBDP: 4.0000 mg | ORAL_TABLET | Freq: Three times a day (TID) | ORAL | 0 refills | Status: AC | PRN

## 2019-11-04 MED ORDER — BENZONATATE 100 MG PO CAPS: 100.0000 mg | ORAL_CAPSULE | Freq: Three times a day (TID) | ORAL | 0 refills | Status: AC

## 2019-11-04 MED ORDER — ALBUTEROL SULFATE HFA 108 (90 BASE) MCG/ACT IN AERS
2.0000 | INHALATION_SPRAY | Freq: Once | RESPIRATORY_TRACT | Status: AC
  Administered 2019-11-04: 2 via RESPIRATORY_TRACT
  Filled 2019-11-04: qty 6.7

## 2019-11-04 MED ORDER — PREDNISONE 10 MG (21) PO TBPK: ORAL_TABLET | ORAL | 0 refills | Status: DC

## 2019-11-04 MED ORDER — SODIUM CHLORIDE 0.9% FLUSH: 3.0000 mL | Freq: Once | INTRAVENOUS | Status: DC

## 2019-11-04 NOTE — ED Triage Notes (Addendum)
Pt reports vomiting, chest tightness and sob that began today. Denies cough, fever, chills. Had covid in December. resp e/u, nad.

## 2019-11-04 NOTE — ED Provider Notes (Signed)
[facility] EMERGENCY DEPARTMENT Provider Note   CSN: [id number] Arrival date & time: 11/04/19  1010     History Chief Complaint  Patient presents with  . Emesis  . Shortness of Breath    Douglas Hayes is a 49 y.o. male with a past medical history of diabetes who presents emergency department with chief complaint of flulike symptoms.  Patient had onset of flulike symptoms including nasal congestion, sore throat and cough starting 5 days ago.  Patient states that he noticed while at work Monday and Tuesday that he is feeling short of breath with exertion.  Today he ate breakfast and started having vomiting and has had one episode of brown watery diarrhea.  He denies any known exposures to coronavirus however did get infected with it last year.  He denies chest pain, unilateral leg swelling, orthopnea or PND.  HPI     Past Medical History:  Diagnosis Date  . Diabetes mellitus without complication (HCC)   . Hypertension    off meds since 2016  . Obesity     Patient Active Problem List   Diagnosis Date Noted  . Acute appendicitis 10/11/2015    Past Surgical History:  Procedure Laterality Date  . LAPAROSCOPIC APPENDECTOMY N/A 10/11/2015   Procedure: APPENDECTOMY LAPAROSCOPIC;  Surgeon: Franky Macho, MD;  Location: [facility];  Service: General;  Laterality: N/A;       Family History  Problem Relation Age of Onset  . Cancer Maternal Aunt   . Cancer Maternal Grandmother   . Cancer Cousin     Social History   Tobacco Use  . Smoking status: Light Tobacco Smoker    Types: Cigarettes  . Smokeless tobacco: Never Used  . Tobacco comment: 1 cigarette every 1-2 weeks  Substance Use Topics  . Alcohol use: Yes    Comment: socially since age 29, daily on weekends until he quit 2-3 months ago in 02/2019   . Drug use: No    Home Medications Prior to Admission medications   Not on File    Allergies    Shrimp [shellfish allergy]  Review of Systems   Review  of Systems Ten systems reviewed and are negative for acute change, except as noted in the HPI.   Physical Exam Updated Vital Signs BP 112/75   Pulse 72   Temp 98.8 F (37.1 C) (Oral)   Resp (!) 26   Ht 5\' 6"  (1.676 m)   Wt 108.9 kg   SpO2 94%   BMI 38.74 kg/m   Physical Exam Vitals and nursing note reviewed.  Constitutional:      General: He is not in acute distress.    Appearance: He is well-developed. He is not diaphoretic.  HENT:     Head: Normocephalic and atraumatic.  Eyes:     General: No scleral icterus.    Conjunctiva/sclera: Conjunctivae normal.  Cardiovascular:     Rate and Rhythm: Normal rate and regular rhythm.     Heart sounds: Normal heart sounds.  Pulmonary:     Effort: Pulmonary effort is normal. No respiratory distress.     Breath sounds: Normal breath sounds. No decreased breath sounds, wheezing or rales.  Chest:     Chest wall: No edema.  Abdominal:     Palpations: Abdomen is soft.     Tenderness: There is no abdominal tenderness.  Musculoskeletal:     Cervical back: Normal range of motion and neck supple.     Right lower leg: No  edema.     Left lower leg: No edema.  Skin:    General: Skin is warm and dry.  Neurological:     Mental Status: He is alert.  Psychiatric:        Behavior: Behavior normal.     ED Results / Procedures / Treatments   Labs (all labs ordered are listed, but only abnormal results are displayed) Labs Reviewed  BASIC METABOLIC PANEL - Abnormal; Notable for the following components:      Result Value   Glucose, Bld 124 (*)    All other components within normal limits  SARS CORONAVIRUS 2 (TAT 6-24 HRS)  CBC  HEPATIC FUNCTION PANEL  LIPASE, BLOOD  TROPONIN I (HIGH SENSITIVITY)  TROPONIN I (HIGH SENSITIVITY)    EKG EKG Interpretation  Date/Time:  Wednesday November 04 2019 10:17:09 EDT Ventricular Rate:  70 PR Interval:  162 QRS Duration: 96 QT Interval:  354 QTC Calculation: 382 R Axis:   74 Text  Interpretation: Normal sinus rhythm Incomplete right bundle branch block Cannot rule out Anterior infarct , age undetermined Abnormal ECG When compared to prior,  similar apperance to ECG in june 2020. no STEMI Confirmed by Theda Belfast ([id number]) on 11/04/2019 11:13:53 AM   Radiology DG Chest 2 View  Result Date: 11/04/2019 CLINICAL DATA:  Chest pain and shortness of breath. EXAM: CHEST - 2 VIEW COMPARISON:  04/13/2011 FINDINGS: The the cardiac silhouette, mediastinal and hilar contours are within normal limits and stable. Stable eventration of the right hemidiaphragm with mild overlying vascular crowding and streaky atelectasis. No infiltrates, edema or effusions. The bony thorax is intact. IMPRESSION: No acute cardiopulmonary findings. Electronically Signed   By: Rudie Meyer M.D.   On: 11/04/2019 11:06    Procedures Procedures (including critical care time)  Medications Ordered in ED Medications  sodium chloride flush (NS) 0.9 % injection 3 mL (3 mLs Intravenous Not Given 11/04/19 1251)    ED Course  I have reviewed the triage vital signs and the nursing notes.  Pertinent labs & imaging results that were available during my care of the patient were reviewed by me and considered in my medical decision making (see chart for details).  Clinical Course as of Nov 03 1736  Wed Nov 04, 2019  1705 Patient not tachypneic.   Resp(!): 30 [SJ]    Clinical Course User Index [SJ] Joy, Hillard Danker, PA-C   MDM Rules/Calculators/A&P                     This is a 49 year old male with history of obesity and diabetes who presents the emergency department with flulike symptoms.  Throughout the patient's visit he has had increasing respiratory rate, decreasing oxygen saturation.  His blood pressures have softened.  The patient previously had COVID-19 infection however I have strong concern for possible reinfection with different variant.  I personally ordered, interpreted and reviewed the patient's labs  which shows normal troponin, CBC without abnormality or elevated white blood cell count, BMP shows mildly elevated glucose of insignificant value.  P[place]ock function panel shows mildly elevated AST level also of insignificant value and lipase within normal limits.  I personally ordered and reviewed the patient's 2 view chest x-ray images which shows no evidence of patchy infiltration or consolidation.  The patient's EKG shows normal sinus rhythm at a rate of 70 without evidence of acute ischemic change.  I have given signout to PA [facility]  I think the patient will need ambulation  with pulse ox to make sure that he is not desaturating.  I do not believe the patient is having CHF exacerbation.  He does not have evidence of pneumonia and currently does not have a fever.  Other considerations are pulmonary embolus although again I have low suspicion for this given the patient's clinical presentation.  The patient is stable in the emergency department at time of signout.   Final Clinical Impression(s) / ED Diagnoses Final diagnoses:  None    Rx / DC Orders ED Discharge Orders    None       Margarita Mail, PA-C 11/04/19 1741    Tegeler, Gwenyth Allegra, MD 11/05/19 1010

## 2019-11-04 NOTE — ED Provider Notes (Signed)
Douglas Hayes is a 49 y.o. male,  presenting to the ED with nasal congestion, sore throat, cough for about the past 5 days.  Development of shortness of breath and some chest tightness over the last couple days.  HPI from Margarita Mail, PA-C: "Douglas Hayes is a 49 y.o. male with a past medical history of diabetes who presents emergency department with chief complaint of flulike symptoms.  Patient had onset of flulike symptoms including nasal congestion, sore throat and cough starting 5 days ago.  Patient states that he noticed while at work Monday and Tuesday that he is feeling short of breath with exertion.  Today he ate breakfast and started having vomiting and has had one episode of brown watery diarrhea.  He denies any known exposures to coronavirus however did get infected with it last year.  He denies chest pain, unilateral leg swelling, orthopnea or PND."  Physical Exam  BP 111/80   Pulse 66   Temp 98.8 F (37.1 C) (Oral)   Resp (!) 29   Ht 5\' 6"  (1.676 m)   Wt 108.9 kg   SpO2 93%   BMI 38.74 kg/m   Physical Exam Vitals and nursing note reviewed.  Constitutional:      General: He is not in acute distress.    Appearance: He is well-developed. He is not diaphoretic.  HENT:     Head: Normocephalic and atraumatic.     Mouth/Throat:     Mouth: Mucous membranes are moist.     Pharynx: Oropharynx is clear.  Eyes:     Conjunctiva/sclera: Conjunctivae normal.  Cardiovascular:     Rate and Rhythm: Normal rate and regular rhythm.     Pulses: Normal pulses.          Radial pulses are 2+ on the right side and 2+ on the left side.       Posterior tibial pulses are 2+ on the right side and 2+ on the left side.     Heart sounds: Normal heart sounds.     Comments: Tactile temperature in the extremities appropriate and equal bilaterally. Pulmonary:     Effort: Pulmonary effort is normal. No respiratory distress.     Breath sounds: Normal breath sounds.     Comments: No increased work of  breathing.  Speaks in full sentences without difficulty. Abdominal:     Palpations: Abdomen is soft.     Tenderness: There is no abdominal tenderness. There is no guarding.  Musculoskeletal:     Cervical back: Neck supple.     Right lower leg: No edema.     Left lower leg: No edema.  Lymphadenopathy:     Cervical: No cervical adenopathy.  Skin:    General: Skin is warm and dry.  Neurological:     Mental Status: He is alert.  Psychiatric:        Mood and Affect: Mood and affect normal.        Speech: Speech normal.        Behavior: Behavior normal.     ED Course/Procedures    Procedures   Abnormal Labs Reviewed      Result Value  BASIC METABOLIC PANEL - Abnormal; Notable for the following components:   Glucose, Bld 124 (*)    All other components within normal limits  HEPATIC FUNCTION PANEL - Abnormal; Notable for the following components:   AST 45 (*)    All other components within normal limits    DG Chest 2  View  Result Date: 11/04/2019 CLINICAL DATA:  Chest pain and shortness of breath. EXAM: CHEST - 2 VIEW COMPARISON:  04/13/2011 FINDINGS: The the cardiac silhouette, mediastinal and hilar contours are within normal limits and stable. Stable eventration of the right hemidiaphragm with mild overlying vascular crowding and streaky atelectasis. No infiltrates, edema or effusions. The bony thorax is intact. IMPRESSION: No acute cardiopulmonary findings. Electronically Signed   By: Rudie Meyer M.D.   On: 11/04/2019 11:06    EKG Interpretation  Date/Time:  Wednesday November 04 2019 10:17:09 EDT Ventricular Rate:  70 PR Interval:  162 QRS Duration: 96 QT Interval:  354 QTC Calculation: 382 R Axis:   74 Text Interpretation: Normal sinus rhythm Incomplete right bundle branch block Cannot rule out Anterior infarct , age undetermined Abnormal ECG When compared to prior,  similar apperance to ECG in june 2020. no STEMI Confirmed by Theda Belfast ([id number]) on 11/04/2019 11:13:53  AM       MDM   Clinical Course as of Nov 04 28  Wed Nov 04, 2019  1705 Patient not tachypneic on my exam.  Resp(!): 30 [SJ]  1720 Patient never had sustained reading of hypoxia throughout his ED course.  SpO2(!): 86 % [SJ]    Clinical Course User Index [SJ] Anselm Pancoast, PA-C   Patient care handoff report received from Arthor Captain, PA-C. Plan: Reviewed rest of the patient's labs, ambulate patient.  Patient presents with cough and shortness of breath. Patient is nontoxic appearing, afebrile, not tachycardic, not tachypneic, not hypotensive, maintains adequate SPO2 on room air, and is in no apparent distress.  Patient was not tachypneic through multiple reassessments in the ED, despite readings recorded through his SPO2 monitor. He had Covid infection a few months ago.  Today he presents with symptoms suggestive of reinfection. Tolerating PO. Ambulated with spo2 92-98%, No SOB, CP. I personally reviewed and interpreted the patient's labs and imaging studies.  The patient was given instructions for home care as well as return precautions. Patient voices understanding of these instructions, accepts the plan, and is comfortable with discharge.  Douglas Hayes was evaluated in Emergency Department on 11/05/2019 for the symptoms described in the history of present illness. He was evaluated in the context of the global COVID-19 pandemic, which necessitated consideration that the patient might be at risk for infection with the SARS-CoV-2 virus that causes COVID-19. Institutional protocols and algorithms that pertain to the evaluation of patients at risk for COVID-19 are in a state of rapid change based on information released by regulatory bodies including the CDC and federal and state organizations. These policies and algorithms were followed during the patient's care in the ED.  Vitals:   11/04/19 1013 11/04/19 1145 11/04/19 1330 11/04/19 1345  BP: 123/86 112/75 112/73 111/80  Pulse: 76  72 65 66  Resp: 17 (!) 26 (!) 26 (!) 29  Temp: 98.8 F (37.1 C)     TempSrc: Oral     SpO2: 96% 94% 94% 93%  Weight: 108.9 kg     Height: 5\' 6"  (1.676 m)         , PA-C 11/05/19 0030    11/07/19, MD 11/06/19 1515

## 2019-11-04 NOTE — Discharge Instructions (Addendum)
General Viral Syndrome Care Instructions:  Your symptoms are likely consistent with a viral illness. Viruses do not require or respond to antibiotics. Treatment is symptomatic care and it is important to note that these symptoms may last for 7-14 days.   Hand washing: Wash your hands throughout the day, but especially before and after touching the face, using the restroom, sneezing, coughing, or touching surfaces that have been coughed or sneezed upon. Hydration: Symptoms of most illnesses will be intensified and complicated by dehydration. Dehydration can also extend the duration of symptoms. Drink plenty of fluids and get plenty of rest. You should be drinking at least half a liter of water an hour to stay hydrated. Electrolyte drinks (ex. Gatorade, Powerade, Pedialyte) are also encouraged. You should be drinking enough fluids to make your urine light yellow, almost clear. If this is not the case, you are not drinking enough water. Please note that some of the treatments indicated below will not be effective if you are not adequately hydrated. Diet: Please concentrate on hydration, however, you may introduce food slowly.  Start with a clear liquid diet, progressed to a full liquid diet, and then bland solids as you are able. Pain or fever: Ibuprofen, Naproxen, or acetaminophen (generic for Tylenol) for pain or fever.  Antiinflammatory medications: Take 600 mg of ibuprofen every 6 hours or 440 mg (over the counter dose) to 500 mg (prescription dose) of naproxen every 12 hours for the next 3 days. After this time, these medications may be used as needed for pain. Take these medications with food to avoid upset stomach. Choose only one of these medications, do not take them together. Acetaminophen (generic for Tylenol): Should you continue to have additional pain while taking the ibuprofen or naproxen, you may add in acetaminophen as needed. Your daily total maximum amount of acetaminophen from all sources  should be limited to 4000mg/day for persons without liver problems, or 2000mg/day for those with liver problems. Nausea/vomiting: Use the ondansetron (generic for Zofran) for nausea or vomiting.  This medication may not prevent all vomiting or nausea, but can help facilitate better hydration. Things that can help with nausea/vomiting also include peppermint/menthol candies, vitamin B12, and ginger. Diarrhea: May use medications such as loperamide (Imodium) or Bismuth subsalicylate (Pepto-Bismol). Cough: Use the benzonatate (generic for Tessalon) for cough.  Teas, warm liquids, broths, and honey can also help with cough. Albuterol: May use the albuterol as needed for instances of shortness of breath. Prednisone: Take the prednisone, as directed, in its entirety. Zyrtec or Claritin: May add these medication daily to control underlying symptoms of congestion, sneezing, and other signs of allergies.  These medications are available over-the-counter. Generics: Cetirizine (generic for Zyrtec) and loratadine (generic for Claritin). Fluticasone: Use fluticasone (generic for Flonase), as directed, for nasal and sinus congestion.  This medication is available over-the-counter. Congestion: Plain guaifenesin (generic for plain Mucinex) may help relieve congestion. Saline sinus rinses and saline nasal sprays may also help relieve congestion. If you do not have high blood pressure, heart problems, or an allergy to such medications, you may also try phenylephrine or Sudafed. Sore throat: Warm liquids or Chloraseptic spray may help soothe a sore throat. Gargle twice a day with a salt water solution made from a half teaspoon of salt in a cup of warm water.  Follow up: Follow up with a primary care provider within the next two weeks should symptoms fail to resolve. Return: Return to the ED for significantly worsening symptoms, shortness of breath,   persistent vomiting, large amounts of blood in stool, or any other major  concerns.  For prescription assistance, may try using prescription discount sites or apps, such as goodrx.com  Test Results for COVID-19 pending  You have a test pending for COVID-19.  Results typically return within about 48 hours.  Be sure to check MyChart for updated results.  We recommend isolating yourself until results are received.  Patients who have symptoms consistent with COVID-19 should self isolated for: At least 3 days (72 hours) have passed since recovery, defined as resolution of fever without the use of fever reducing medications and improvement in respiratory symptoms (e.g., cough, shortness of breath), and At least 7 days have passed since symptoms first appeared.  If you have no symptoms, but your test returns positive, recommend isolating for at least 10 days.

## 2019-11-04 NOTE — ED Notes (Signed)
Ambulated pt in room, pt spo2 fluctuated between 92-98%.

## 2019-11-11 ENCOUNTER — Inpatient Hospital Stay: Payer: 59 | Admitting: Nurse Practitioner

## 2019-11-11 ENCOUNTER — Inpatient Hospital Stay: Payer: 59

## 2019-12-02 ENCOUNTER — Inpatient Hospital Stay (HOSPITAL_BASED_OUTPATIENT_CLINIC_OR_DEPARTMENT_OTHER): Payer: 59 | Admitting: Nurse Practitioner

## 2019-12-02 ENCOUNTER — Telehealth: Payer: Self-pay

## 2019-12-02 ENCOUNTER — Other Ambulatory Visit: Payer: Self-pay

## 2019-12-02 ENCOUNTER — Encounter: Payer: Self-pay | Admitting: Nurse Practitioner

## 2019-12-02 ENCOUNTER — Inpatient Hospital Stay: Payer: 59 | Attending: Nurse Practitioner

## 2019-12-02 VITALS — BP 117/75 | HR 93 | Temp 97.9°F | Resp 17 | Ht 66.0 in | Wt 243.4 lb

## 2019-12-02 DIAGNOSIS — E669 Obesity, unspecified: Secondary | ICD-10-CM | POA: Diagnosis not present

## 2019-12-02 DIAGNOSIS — Z809 Family history of malignant neoplasm, unspecified: Secondary | ICD-10-CM | POA: Insufficient documentation

## 2019-12-02 DIAGNOSIS — Z79899 Other long term (current) drug therapy: Secondary | ICD-10-CM | POA: Insufficient documentation

## 2019-12-02 DIAGNOSIS — Z862 Personal history of diseases of the blood and blood-forming organs and certain disorders involving the immune mechanism: Secondary | ICD-10-CM

## 2019-12-02 DIAGNOSIS — Z8616 Personal history of COVID-19: Secondary | ICD-10-CM | POA: Diagnosis not present

## 2019-12-02 DIAGNOSIS — E119 Type 2 diabetes mellitus without complications: Secondary | ICD-10-CM | POA: Insufficient documentation

## 2019-12-02 DIAGNOSIS — D696 Thrombocytopenia, unspecified: Secondary | ICD-10-CM | POA: Insufficient documentation

## 2019-12-02 DIAGNOSIS — I10 Essential (primary) hypertension: Secondary | ICD-10-CM | POA: Diagnosis not present

## 2019-12-02 LAB — CBC WITH DIFFERENTIAL (CANCER CENTER ONLY)
Abs Immature Granulocytes: 0.03 10*3/uL (ref 0.00–0.07)
Basophils Absolute: 0 10*3/uL (ref 0.0–0.1)
Basophils Relative: 0 %
Eosinophils Absolute: 0.2 10*3/uL (ref 0.0–0.5)
Eosinophils Relative: 3 %
HCT: 42.7 % (ref 39.0–52.0)
Hemoglobin: 14.4 g/dL (ref 13.0–17.0)
Immature Granulocytes: 0 %
Lymphocytes Relative: 27 %
Lymphs Abs: 2 10*3/uL (ref 0.7–4.0)
MCH: 28.6 pg (ref 26.0–34.0)
MCHC: 33.7 g/dL (ref 30.0–36.0)
MCV: 84.9 fL (ref 80.0–100.0)
Monocytes Absolute: 0.7 10*3/uL (ref 0.1–1.0)
Monocytes Relative: 9 %
Neutro Abs: 4.5 10*3/uL (ref 1.7–7.7)
Neutrophils Relative %: 61 %
Platelet Count: 227 10*3/uL (ref 150–400)
RBC: 5.03 MIL/uL (ref 4.22–5.81)
RDW: 14.2 % (ref 11.5–15.5)
WBC Count: 7.4 10*3/uL (ref 4.0–10.5)
nRBC: 0 % (ref 0.0–0.2)

## 2019-12-02 NOTE — Telephone Encounter (Signed)
Per Santiago Glad NP faxed over today's note to patients PCP Earnestine Mealing MD at [facility] Phone # ( [phone]) Fax# [phone])

## 2019-12-02 NOTE — Progress Notes (Signed)
[place]   [phone] [phone]   Clinic Follow up Note   Patient Care Team: Inc, [facility] as PCP - General 12/02/2019  CHIEF COMPLAINT: F/u thrombocytopenia   CURRENT THERAPY: None  INTERVAL HISTORY: Mr. Snoke returns for f/u as scheduled. He was seen initially in 04/2019 after he was found to have isolated thrombocytopenia of 39K while in the ED for food allergy. Repeat and subsequent CBCs have been normal since then. He tested positive for Sars-Cov-2 covid19 on 06/2019 and again on 11/04/2019. He notes second time he was sicker, with flu like symptoms and shortness of breath. He did not get antibody therapy because he felt better. He received the first COVID19 vaccine on Monday 5/17 and he feels tired with sore arm. His respiratory symptoms are nearly resolved. Otherwise, he is doing well. He is losing weight intentionally. He had bleeding in stool while he was on steroids for covid infection last month, then resolved. He thinks he had a colonoscopy in [place] years ago. Denies other bleeding or bruising. Denies n/v/c/d, pain, signs of thrombosis, cough, or chest pain. He is on Metformin for DM. He only smokes once a month and drinks once every 1-2 weeks.    MEDICAL HISTORY:  Past Medical History:  Diagnosis Date  . Diabetes mellitus without complication ([place])   . Hypertension    off meds since 2016  . Obesity     SURGICAL HISTORY: Past Surgical History:  Procedure Laterality Date  . LAPAROSCOPIC APPENDECTOMY N/A 10/11/2015   Procedure: APPENDECTOMY LAPAROSCOPIC;  Surgeon: Aviva Signs, MD;  Location: [facility];  Service: General;  Laterality: N/A;    I have reviewed the social history and family history with the patient and they are unchanged from previous note.  ALLERGIES:  is allergic to shrimp [shellfish allergy].  MEDICATIONS:  Current Outpatient Medications  Medication Sig Dispense Refill  . benzonatate (TESSALON)  100 MG capsule Take 1 capsule (100 mg total) by mouth every 8 (eight) hours. 21 capsule 0  . ondansetron (ZOFRAN ODT) 4 MG disintegrating tablet Take 1 tablet (4 mg total) by mouth every 8 (eight) hours as needed for nausea or vomiting. 20 tablet 0   No current facility-administered medications for this visit.   Patient also taking metformin 2 tabs daily qAM  PHYSICAL EXAMINATION:  Vitals:   12/02/19 1447  BP: 117/75  Pulse: 93  Resp: 17  Temp: 97.9 F (36.6 C)  SpO2: 98%   Filed Weights   12/02/19 1447  Weight: 243 lb 6.4 oz (110.4 kg)    GENERAL:alert, no distress and comfortable SKIN: no rash or bruising to exposed skin  EYES:  sclera clear LUNGS:  normal breathing effort HEART:  no lower extremity edema ABDOMEN:abdomen round, obese  NEURO: alert & oriented x 3 with fluent speech, normal gait   LABORATORY DATA:  I have reviewed the data as listed CBC Latest Ref Rng & Units 12/02/2019 11/04/2019 08/13/2019  WBC 4.0 - 10.5 K/uL 7.4 9.6 7.7  Hemoglobin 13.0 - 17.0 g/dL 14.4 15.2 15.0  Hematocrit 39.0 - 52.0 % 42.7 46.4 45.3  Platelets 150 - 400 K/uL 227 238 233      RADIOGRAPHIC STUDIES: I have personally reviewed the radiological images as listed and agreed with the findings in the report. No results found.   ASSESSMENT & PLAN: 49 yo male with h/o alcohol use and isolated thrombocytopenia  1. H/o thrombocytopenia -He had isolated thrombocytopenia with platelet count 39K in the  ED on 04/13/19, no other cytopenias.  -Repeat CBC at our initial consult 05/13/19 and subsequent labs have been normal  -his isolated thrombocytopenia could have been lab error.  -Mr. Nakama appears stable today. CBC is completely normal, he does not require additional work up  -I recommend for him to f/u with PCP with CBC q3-6 months, we will see him back if needed in the future.   -If he has recurrent thrombocytopenia, we would then do further work up such as abdominal US to r/o liver  disease or splenomegaly, Hep B, Hep C, HIV to r/o viral etiology, and B12 to r/o nutritional deficiency.  -He does report long term alcohol use but has cut back. We previously discussed alcoholism can lead to liver disease such as cirrhosis that is a direct cause of thrombocytopenia and potential other serious complications such as liver failure and hepatocellular carcinoma.  -F/u open   2. COVID19 infection  -He tested positive for sars-cov-2 in 06/2019 and again in 11/04/2019, he was symptomatic with flu like symptoms and shortness of breath. He was managed outpatient. He was offered monoclonal antibody treatment due to his obesity and DM, but he did not receive it -respiratory symptoms nearly resolved, mild exertional dyspnea  -he received first COVID19 vaccine on 5/17, feeling tired and sore but otherwise tolerated well  -Due to his medical co-morbidities, he may benefit from close PCP f/u to monitor his recovery, or pulmonary referral if respiratory symptoms do not resolve    3. Routine health maintenance  -he has strong maternal family history of cancer, but does not know what type -He has not had routine medical care until recently, PCP is [facility] but he does not recall the provider's name.  -He thinks he had colonoscopy some years ago in [place], but not sure. He had an episode of rectal bleeding when he was on steroids for COVID19 recently that resolved. I recommend for him to consider repeat colonoscopy if he has recurrent bleeding. He agrees. PCP can manage.  He has not had PSA  -I recommend to continue close PCP f/u for routine health maintenance, age-appropriate cancer screenings, DM management, abstain from alcohol and smoking, and healthy lifestyle with diet/exercise.  -patient agrees -I will fax my note to his PCP   PLAN: -CBC normal, no additional work up recommended  -F/u with PCP, check CBC q3-6 months for 1 year then at least annually if it remains normal    -We will see him back if needed in the future, if he develops recurrent thrombocytopenia or other concerns, f/u open   -Recommend age appropriate cancer screenings including colonoscopy, PSA if he has not already done so  -Will fax my note to PCP   All questions were answered. The patient knows to call the clinic with any problems, questions or concerns. No barriers to learning was detected.     Pollyann Samples, NP 12/02/19

## 2020-03-10 DIAGNOSIS — E119 Type 2 diabetes mellitus without complications: Secondary | ICD-10-CM | POA: Diagnosis not present

## 2020-03-10 DIAGNOSIS — Z1159 Encounter for screening for other viral diseases: Secondary | ICD-10-CM | POA: Diagnosis not present

## 2020-03-10 DIAGNOSIS — D696 Thrombocytopenia, unspecified: Secondary | ICD-10-CM | POA: Diagnosis not present

## 2020-03-10 DIAGNOSIS — Z1389 Encounter for screening for other disorder: Secondary | ICD-10-CM | POA: Diagnosis not present

## 2020-03-10 DIAGNOSIS — R7309 Other abnormal glucose: Secondary | ICD-10-CM | POA: Diagnosis not present

## 2020-03-10 DIAGNOSIS — Z114 Encounter for screening for human immunodeficiency virus [HIV]: Secondary | ICD-10-CM | POA: Diagnosis not present

## 2020-10-29 IMAGING — CT CT ANGIO NECK
1 of 14 series · 4 of 33 positions shown · IV contrast (omnipaque)
Comparison: None.

CLINICAL DATA: Numbness tingling paresthesia. Left facial numbness
and left eye twitching



[Series 10: cta neck thins · axial · 0.43mm/px · z∈[+1282,+1501]mm · 4 of 914 slices shown]
[im 183/914  soft-tissue]
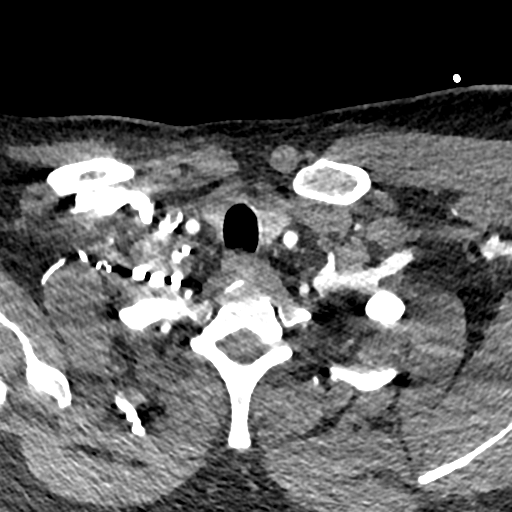
[im 366/914  bone]
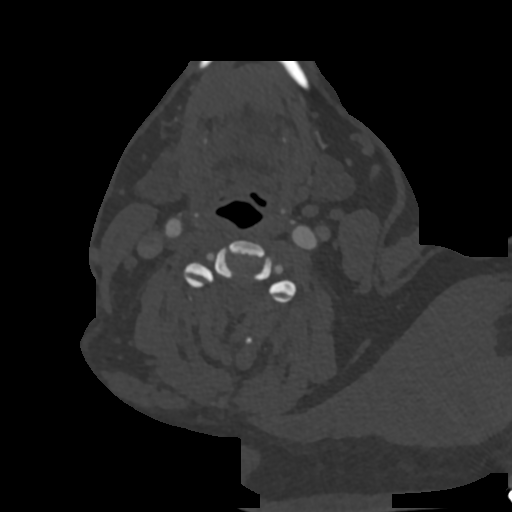
[im 548/914  soft-tissue]
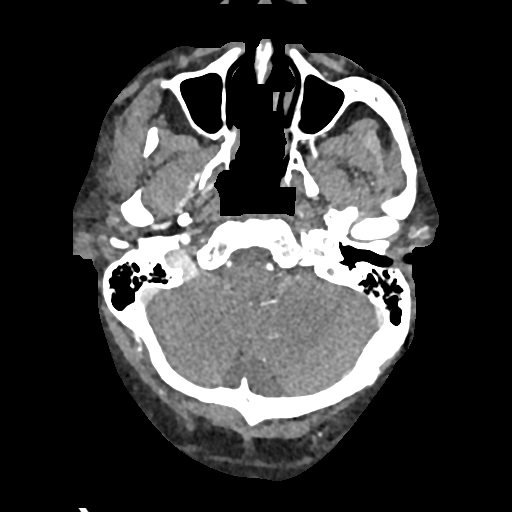
[im 731/914  bone]
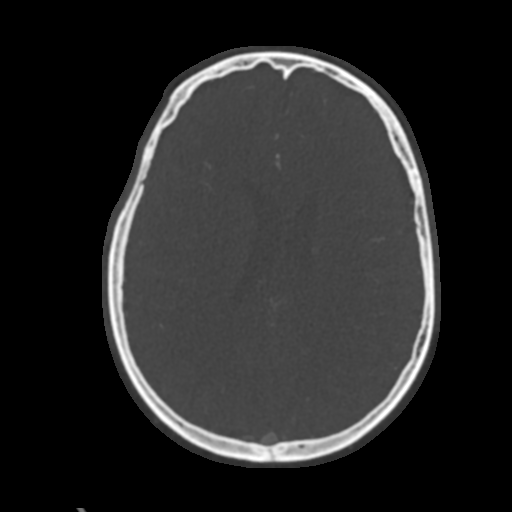

[4 of 33 positions shown; findings below may reference images not displayed]

FINDINGS: CT HEAD FINDINGS

Brain: No evidence of acute infarction, hemorrhage, hydrocephalus,
extra-axial collection or mass lesion/mass effect.

Vascular: Negative for hyperdense vessel

Skull: Negative

Sinuses: Mild mucosal edema paranasal sinuses.

Orbits: Negative

Review of the MIP images confirms the above findings

CTA NECK FINDINGS

Aortic arch: Standard branching. Imaged portion shows no evidence of
aneurysm or dissection. No significant stenosis of the major arch
vessel origins.

Right carotid system: Normal right carotid. Negative for
atherosclerotic disease or dissection.

Left carotid system: Normal left carotid. Negative for
atherosclerotic disease or dissection.

Vertebral arteries: Normal vertebral arteries bilaterally
bilaterally. Widely patent to the basilar.

Skeleton: Negative

Other neck: Negative

Upper chest: Lung apices clear bilaterally.

Review of the MIP images confirms the above findings

CTA HEAD FINDINGS

Anterior circulation: Cavernous carotid widely patent without
disease. Anterior and middle cerebral arteries normal bilaterally.

Posterior circulation: Both vertebral arteries widely patent. PICA
patent bilaterally. Basilar patent. Superior cerebellar and
posterior cerebral arteries patent bilaterally

Venous sinuses: Normal venous enhancement

Anatomic variants: Negative for cerebral aneurysm.

Review of the MIP images confirms the above findings
IMPRESSION: Negative CT head

Negative CTA head neck.

## 2021-02-08 ENCOUNTER — Ambulatory Visit (HOSPITAL_COMMUNITY)
Admission: EM | Admit: 2021-02-08 | Discharge: 2021-02-08 | Disposition: A | Discharge status: Home / Self Care | Payer: 59 | Attending: Urgent Care | Admitting: Urgent Care

## 2021-02-08 ENCOUNTER — Encounter (HOSPITAL_COMMUNITY): Payer: Self-pay

## 2021-02-08 ENCOUNTER — Other Ambulatory Visit: Payer: Self-pay

## 2021-02-08 DIAGNOSIS — Z711 Person with feared health complaint in whom no diagnosis is made: Secondary | ICD-10-CM | POA: Diagnosis not present

## 2021-02-08 DIAGNOSIS — H1013 Acute atopic conjunctivitis, bilateral: Secondary | ICD-10-CM | POA: Diagnosis not present

## 2021-02-08 DIAGNOSIS — Z7984 Long term (current) use of oral hypoglycemic drugs: Secondary | ICD-10-CM | POA: Diagnosis not present

## 2021-02-08 DIAGNOSIS — E118 Type 2 diabetes mellitus with unspecified complications: Secondary | ICD-10-CM

## 2021-02-08 DIAGNOSIS — E119 Type 2 diabetes mellitus without complications: Secondary | ICD-10-CM

## 2021-02-08 MED ORDER — AZELASTINE HCL 0.05 % OP SOLN: 1.0000 [drp] | Freq: Two times a day (BID) | OPHTHALMIC | 12 refills | Status: AC

## 2021-02-08 NOTE — ED Triage Notes (Signed)
Pt presents with c/o left eye redness and irritation x 2 days.   States he is diabetic and is concerned he has high blood pressure.

## 2021-02-08 NOTE — ED Provider Notes (Signed)
[facility]   MRN: [id number] DOB: [birth year]  Subjective:   Douglas Hayes is a 50 y.o. male presenting for 2-day history of left eye redness, irritation.  Not starting to have similar symptoms in the right eye.  Denies eye trauma, vision changes, eye pain, eye drainage, matting of his eyelids.  He is also worried about his blood pressure as he has felt a left-sided headache intermittently for the past week.  Denies an ongoing headache, confusion, dizziness, weakness, chest pain, shortness of breath, heart racing, nausea, vomiting, abdominal pain.  Patient is a type II diabetic not treated with insulin, takes oral medications.  Has not had follow-up with his regular doctor recently but plans on doing so.  No current facility-administered medications for this encounter.  Current Outpatient Medications:    benzonatate (TESSALON) 100 MG capsule, Take 1 capsule (100 mg total) by mouth every 8 (eight) hours., Disp: 21 capsule, Rfl: 0   ondansetron (ZOFRAN ODT) 4 MG disintegrating tablet, Take 1 tablet (4 mg total) by mouth every 8 (eight) hours as needed for nausea or vomiting., Disp: 20 tablet, Rfl: 0   Allergies  Allergen Reactions   Shrimp [Shellfish Allergy] Anaphylaxis    Ate chinese food with shrimp and started with itching , vomiting , progressed to  hives then SOB.     Past Medical History:  Diagnosis Date   Diabetes mellitus without complication (HCC)    Hypertension    off meds since 2016   Obesity      Past Surgical History:  Procedure Laterality Date   LAPAROSCOPIC APPENDECTOMY N/A 10/11/2015   Procedure: APPENDECTOMY LAPAROSCOPIC;  Surgeon: Franky Macho, MD;  Location: [facility];  Service: General;  Laterality: N/A;    Family History  Problem Relation Age of Onset   Cancer Maternal Aunt    Cancer Maternal Grandmother    Cancer Cousin     Social History   Tobacco Use   Smoking status: Light Smoker    Types: Cigarettes   Smokeless tobacco: Never    Tobacco comments:    1 cigarette every 1-2 weeks  Vaping Use   Vaping Use: Never used  Substance Use Topics   Alcohol use: Yes    Comment: socially since age 67, daily on weekends until he quit 2-3 months ago in 02/2019    Drug use: No    ROS   Objective:   Vitals: BP 117/83   Pulse 89   Temp 99.2 F (37.3 C) (Oral)   SpO2 97%   BP Readings from Last 3 Encounters:  02/08/21 117/83  12/02/19 117/75  11/04/19 (!) 109/55   Physical Exam Constitutional:      General: He is not in acute distress.    Appearance: Normal appearance. He is well-developed. He is not ill-appearing, toxic-appearing or diaphoretic.  HENT:     Head: Normocephalic and atraumatic.     Right Ear: External ear normal.     Left Ear: External ear normal.     Nose: Nose normal.     Mouth/Throat:     Mouth: Mucous membranes are moist.     Pharynx: Oropharynx is clear.  Eyes:     General: Lids are everted, no foreign bodies appreciated. No scleral icterus.       Right eye: No foreign body, discharge or hordeolum.        Left eye: No foreign body, discharge or hordeolum.     Extraocular Movements: Extraocular movements intact.  Conjunctiva/sclera:     Right eye: Right conjunctiva is injected. No chemosis, exudate or hemorrhage.    Left eye: Left conjunctiva is injected. No chemosis, exudate or hemorrhage.    Pupils: Pupils are equal, round, and reactive to light.  Cardiovascular:     Rate and Rhythm: Normal rate and regular rhythm.     Heart sounds: Normal heart sounds. No murmur heard.   No friction rub. No gallop.  Pulmonary:     Effort: Pulmonary effort is normal. No respiratory distress.     Breath sounds: Normal breath sounds. No stridor. No wheezing, rhonchi or rales.  Neurological:     Mental Status: He is alert and oriented to person, place, and time.     Cranial Nerves: No cranial nerve deficit or facial asymmetry.     Motor: No weakness.     Coordination: Romberg sign negative.  Coordination normal.     Gait: Gait normal.     Deep Tendon Reflexes: Reflexes normal.     Comments: Negative pronator drift.  Psychiatric:        Mood and Affect: Mood normal. Mood is not anxious or depressed.        Speech: Speech normal.        Behavior: Behavior normal. Behavior is not agitated.        Thought Content: Thought content normal.        Cognition and Memory: Cognition is not impaired. Memory is not impaired.    Assessment and Plan :   PDMP not reviewed this encounter.  1. Allergic conjunctivitis of both eyes   2. Worried well   3. Type 2 diabetes mellitus treated without insulin (HCC)     Start azelastine eyedrops for what I suspect is allergic conjunctivitis of both eyes.  No sign of an acute encephalopathy.  Reassured patient that he has excellent physical exam findings and vital signs.  Recommended follow-up as soon as possible with his PCP for recheck on his diabetes. Counseled patient on potential for adverse effects with medications prescribed/recommended today, ER and return-to-clinic precautions discussed, patient verbalized understanding.    Wallis Bamberg, [place] 02/08/21 1913

## 2021-04-17 DIAGNOSIS — Z0000 Encounter for general adult medical examination without abnormal findings: Secondary | ICD-10-CM | POA: Diagnosis not present

## 2021-04-17 DIAGNOSIS — D696 Thrombocytopenia, unspecified: Secondary | ICD-10-CM | POA: Diagnosis not present

## 2021-04-17 DIAGNOSIS — Z23 Encounter for immunization: Secondary | ICD-10-CM | POA: Diagnosis not present

## 2021-04-17 DIAGNOSIS — E119 Type 2 diabetes mellitus without complications: Secondary | ICD-10-CM | POA: Diagnosis not present

## 2021-04-17 DIAGNOSIS — Z1211 Encounter for screening for malignant neoplasm of colon: Secondary | ICD-10-CM | POA: Diagnosis not present

## 2021-06-05 ENCOUNTER — Other Ambulatory Visit: Payer: Self-pay

## 2021-06-05 ENCOUNTER — Ambulatory Visit (HOSPITAL_COMMUNITY)
Admission: EM | Admit: 2021-06-05 | Discharge: 2021-06-05 | Disposition: A | Discharge status: Home / Self Care | Payer: 59 | Attending: Urgent Care | Admitting: Urgent Care

## 2021-06-05 ENCOUNTER — Encounter (HOSPITAL_COMMUNITY): Payer: Self-pay | Admitting: Emergency Medicine

## 2021-06-05 DIAGNOSIS — M436 Torticollis: Secondary | ICD-10-CM | POA: Diagnosis not present

## 2021-06-05 MED ORDER — NAPROXEN 500 MG PO TABS: 500.0000 mg | ORAL_TABLET | Freq: Two times a day (BID) | ORAL | 0 refills | Status: AC

## 2021-06-05 MED ORDER — TIZANIDINE HCL 4 MG PO TABS: 4.0000 mg | ORAL_TABLET | Freq: Every day | ORAL | 0 refills | Status: AC

## 2021-06-05 NOTE — ED Provider Notes (Signed)
[facility]   MRN: [id number] DOB: [birth year]  Subjective:   Douglas Hayes is a 50 y.o. male presenting for 2 to 3-day history of progressively worsening left-sided neck pain with stiffness and difficulty turning his head.  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  Symptoms started after one of his shifts, woke up the next morning and continue to worsen.  Currently his pain is rated 5 out of 10.  No fall, trauma, weakness, numbness or tingling, headache, vision changes.  No history of stroke, heart disease.  He does have a history of high blood pressure but is no longer taking any medications for this.   No current facility-administered medications for this encounter.  Current Outpatient Medications:    azelastine (OPTIVAR) 0.05 % ophthalmic solution, Place 1 drop into both eyes 2 (two) times daily., Disp: 6 mL, Rfl: 12   benzonatate (TESSALON) 100 MG capsule, Take 1 capsule (100 mg total) by mouth every 8 (eight) hours., Disp: 21 capsule, Rfl: 0   ondansetron (ZOFRAN ODT) 4 MG disintegrating tablet, Take 1 tablet (4 mg total) by mouth every 8 (eight) hours as needed for nausea or vomiting., Disp: 20 tablet, Rfl: 0   Allergies  Allergen Reactions   Shrimp [Shellfish Allergy] Anaphylaxis    Ate chinese food with shrimp and started with itching , vomiting , progressed to  hives then SOB.     Past Medical History:  Diagnosis Date   Diabetes mellitus without complication (HCC)    Hypertension    off meds since 2016   Obesity      Past Surgical History:  Procedure Laterality Date   LAPAROSCOPIC APPENDECTOMY N/A 10/11/2015   Procedure: APPENDECTOMY LAPAROSCOPIC;  Surgeon: Franky Macho, MD;  Location: [facility];  Service: General;  Laterality: N/A;    Family History  Problem Relation Age of Onset   Cancer Maternal Aunt    Cancer Maternal Grandmother    Cancer Cousin     Social History   Tobacco Use   Smoking status: Light  Smoker    Types: Cigarettes   Smokeless tobacco: Never   Tobacco comments:    1 cigarette every 1-2 weeks  Vaping Use   Vaping Use: Never used  Substance Use Topics   Alcohol use: Yes    Comment: socially since age 47, daily on weekends until he quit 2-3 months ago in 02/2019    Drug use: No    ROS   Objective:   Vitals: BP 124/84 (BP Location: Left Arm)   Pulse 86   Temp 98.6 F (37 C) (Oral)   Resp 16   SpO2 95%   Physical Exam Constitutional:      General: He is not in acute distress.    Appearance: Normal appearance. He is well-developed and normal weight. He is not ill-appearing, toxic-appearing or diaphoretic.  HENT:     Head: Normocephalic and atraumatic.     Right Ear: Tympanic membrane, ear canal and external ear normal. There is no impacted cerumen.     Left Ear: Tympanic membrane, ear canal and external ear normal. There is no impacted cerumen.     Nose: Nose normal. No congestion or rhinorrhea.     Mouth/Throat:     Mouth: Mucous membranes are moist.     Pharynx: Oropharynx is clear. No oropharyngeal exudate or posterior oropharyngeal erythema.  Eyes:     General: No scleral icterus.  Right eye: No discharge.        Left eye: No discharge.     Extraocular Movements: Extraocular movements intact.     Conjunctiva/sclera: Conjunctivae normal.     Pupils: Pupils are equal, round, and reactive to light.  Cardiovascular:     Rate and Rhythm: Normal rate and regular rhythm.     Heart sounds: Normal heart sounds. No murmur heard.   No friction rub. No gallop.  Pulmonary:     Effort: Pulmonary effort is normal. No respiratory distress.     Breath sounds: Normal breath sounds. No stridor. No wheezing, rhonchi or rales.  Musculoskeletal:     Cervical back: Normal range of motion and neck supple. No rigidity. No muscular tenderness.     Comments: Strength 5/5 for upper and lower extremities.  Has tenderness along the left paraspinal muscles of the cervical  region worse at the base of his neck.  Negative Spurling maneuver and Lhermitte sign.  Very limited rotation to the left.  Skin:    General: Skin is warm and dry.  Neurological:     General: No focal deficit present.     Mental Status: He is alert and oriented to person, place, and time.     Cranial Nerves: No cranial nerve deficit.     Motor: No weakness.     Coordination: Coordination normal.     Gait: Gait normal.     Deep Tendon Reflexes: Reflexes normal.     Comments: Negative Romberg and pronator drift.  No facial asymmetry.  Psychiatric:        Mood and Affect: Mood normal.        Behavior: Behavior normal.        Thought Content: Thought content normal.    Assessment and Plan :   PDMP not reviewed this encounter.  1. Torticollis    Will manage conservatively for torticollis with naproxen, tizanidine.  At this time I do not suspect an acute encephalopathy, stroke, TIA.  Given lack of chest pain and hemodynamically stable vital signs I do not suspect ACS despite the left-sided neck pain.  Recommended very close follow-up with his PCP who he has regular checks with.  Counseled patient on potential for adverse effects with medications prescribed/recommended today, ER and return-to-clinic precautions discussed, patient verbalized understanding.    Wallis Bamberg, PA-C 06/05/21 1349

## 2021-06-05 NOTE — ED Triage Notes (Signed)
Pt c/o stiff neck, states he cannot turn his neck to either side fully. Endorses pain on the back of his head and base of skull. States this came on when he was working.

## 2022-03-05 DIAGNOSIS — Z712 Person consulting for explanation of examination or test findings: Secondary | ICD-10-CM | POA: Diagnosis not present

## 2022-03-05 DIAGNOSIS — Z1389 Encounter for screening for other disorder: Secondary | ICD-10-CM | POA: Diagnosis not present

## 2022-03-05 DIAGNOSIS — E119 Type 2 diabetes mellitus without complications: Secondary | ICD-10-CM | POA: Diagnosis not present

## 2022-03-05 DIAGNOSIS — E669 Obesity, unspecified: Secondary | ICD-10-CM | POA: Diagnosis not present

## 2022-03-05 DIAGNOSIS — Z0000 Encounter for general adult medical examination without abnormal findings: Secondary | ICD-10-CM | POA: Diagnosis not present

## 2022-03-05 DIAGNOSIS — R7309 Other abnormal glucose: Secondary | ICD-10-CM | POA: Diagnosis not present

## 2022-05-14 DIAGNOSIS — E669 Obesity, unspecified: Secondary | ICD-10-CM | POA: Diagnosis not present

## 2022-05-14 DIAGNOSIS — E119 Type 2 diabetes mellitus without complications: Secondary | ICD-10-CM | POA: Diagnosis not present

## 2022-05-14 DIAGNOSIS — Z0000 Encounter for general adult medical examination without abnormal findings: Secondary | ICD-10-CM | POA: Diagnosis not present

## 2022-05-14 DIAGNOSIS — R7309 Other abnormal glucose: Secondary | ICD-10-CM | POA: Diagnosis not present

## 2022-05-14 DIAGNOSIS — Z712 Person consulting for explanation of examination or test findings: Secondary | ICD-10-CM | POA: Diagnosis not present

## 2022-05-14 DIAGNOSIS — Z1389 Encounter for screening for other disorder: Secondary | ICD-10-CM | POA: Diagnosis not present

## 2023-05-31 ENCOUNTER — Other Ambulatory Visit (HOSPITAL_COMMUNITY): Payer: Self-pay

## 2023-06-03 ENCOUNTER — Other Ambulatory Visit (HOSPITAL_COMMUNITY): Payer: Self-pay

## 2023-06-03 MED ORDER — JARDIANCE 10 MG PO TABS
10.0000 mg | ORAL_TABLET | Freq: Every day | ORAL | 3 refills | Status: AC
  Filled 2023-06-03: qty 90, 90d supply, fill #0
  Filled 2023-06-05 (×2): qty 30, 30d supply, fill #0

## 2023-06-03 MED ORDER — METFORMIN HCL 500 MG PO TABS
1000.0000 mg | ORAL_TABLET | Freq: Two times a day (BID) | ORAL | 2 refills | Status: AC
  Filled 2023-06-03: qty 360, 90d supply, fill #0

## 2023-06-05 ENCOUNTER — Other Ambulatory Visit (HOSPITAL_COMMUNITY): Payer: Self-pay

## 2023-06-06 DIAGNOSIS — E119 Type 2 diabetes mellitus without complications: Secondary | ICD-10-CM | POA: Diagnosis not present

## 2023-06-06 DIAGNOSIS — Z1389 Encounter for screening for other disorder: Secondary | ICD-10-CM | POA: Diagnosis not present

## 2023-06-06 DIAGNOSIS — Z1331 Encounter for screening for depression: Secondary | ICD-10-CM | POA: Diagnosis not present

## 2023-06-06 DIAGNOSIS — Z0000 Encounter for general adult medical examination without abnormal findings: Secondary | ICD-10-CM | POA: Diagnosis not present

## 2023-06-06 DIAGNOSIS — E669 Obesity, unspecified: Secondary | ICD-10-CM | POA: Diagnosis not present

## 2023-06-06 DIAGNOSIS — Z712 Person consulting for explanation of examination or test findings: Secondary | ICD-10-CM | POA: Diagnosis not present

## 2024-05-04 DIAGNOSIS — Z712 Person consulting for explanation of examination or test findings: Secondary | ICD-10-CM | POA: Diagnosis not present

## 2024-05-04 DIAGNOSIS — Z1389 Encounter for screening for other disorder: Secondary | ICD-10-CM | POA: Diagnosis not present

## 2024-05-04 DIAGNOSIS — E119 Type 2 diabetes mellitus without complications: Secondary | ICD-10-CM | POA: Diagnosis not present

## 2024-05-04 DIAGNOSIS — Z23 Encounter for immunization: Secondary | ICD-10-CM | POA: Diagnosis not present

## 2024-05-11 ENCOUNTER — Other Ambulatory Visit: Payer: Self-pay

## 2024-05-11 ENCOUNTER — Emergency Department (HOSPITAL_COMMUNITY)
Admission: EM | Admit: 2024-05-11 | Discharge: 2024-05-11 | Disposition: A | Discharge status: Home / Self Care | Attending: Emergency Medicine | Admitting: Emergency Medicine

## 2024-05-11 ENCOUNTER — Encounter (HOSPITAL_COMMUNITY): Payer: Self-pay

## 2024-05-11 ENCOUNTER — Emergency Department (HOSPITAL_COMMUNITY)

## 2024-05-11 DIAGNOSIS — R1010 Upper abdominal pain, unspecified: Secondary | ICD-10-CM | POA: Insufficient documentation

## 2024-05-11 DIAGNOSIS — K429 Umbilical hernia without obstruction or gangrene: Secondary | ICD-10-CM | POA: Diagnosis not present

## 2024-05-11 DIAGNOSIS — E119 Type 2 diabetes mellitus without complications: Secondary | ICD-10-CM | POA: Insufficient documentation

## 2024-05-11 DIAGNOSIS — Z7984 Long term (current) use of oral hypoglycemic drugs: Secondary | ICD-10-CM | POA: Insufficient documentation

## 2024-05-11 DIAGNOSIS — K8689 Other specified diseases of pancreas: Secondary | ICD-10-CM | POA: Diagnosis not present

## 2024-05-11 DIAGNOSIS — I10 Essential (primary) hypertension: Secondary | ICD-10-CM | POA: Insufficient documentation

## 2024-05-11 DIAGNOSIS — R109 Unspecified abdominal pain: Secondary | ICD-10-CM | POA: Diagnosis not present

## 2024-05-11 DIAGNOSIS — K760 Fatty (change of) liver, not elsewhere classified: Secondary | ICD-10-CM | POA: Diagnosis not present

## 2024-05-11 LAB — COMPREHENSIVE METABOLIC PANEL WITH GFR
ALT: 20 U/L (ref 0–44)
AST: 19 U/L (ref 15–41)
Albumin: 4.1 g/dL (ref 3.5–5.0)
Alkaline Phosphatase: 62 U/L (ref 38–126)
Anion gap: 14 (ref 5–15)
BUN: 13 mg/dL (ref 6–20)
CO2: 24 mmol/L (ref 22–32)
Calcium: 9.3 mg/dL (ref 8.9–10.3)
Chloride: 98 mmol/L (ref 98–111)
Creatinine, Ser: 0.68 mg/dL (ref 0.61–1.24)
GFR, Estimated: >60 mL/min (ref >60)
Glucose, Bld: 186 mg/dL — ABNORMAL HIGH (ref 70–99)
Potassium: 4.1 mmol/L (ref 3.5–5.1)
Sodium: 136 mmol/L (ref 135–145)
Total Bilirubin: 0.4 mg/dL (ref 0.0–1.2)
Total Protein: 7.2 g/dL (ref 6.5–8.1)

## 2024-05-11 LAB — CBC
HCT: 45.9 % (ref 39.0–52.0)
Hemoglobin: 15.3 g/dL (ref 13.0–17.0)
MCH: 29.5 pg (ref 26.0–34.0)
MCHC: 33.3 g/dL (ref 30.0–36.0)
MCV: 88.4 fL (ref 80.0–100.0)
Platelets: 205 K/uL (ref 150–400)
RBC: 5.19 MIL/uL (ref 4.22–5.81)
RDW: 13.8 % (ref 11.5–15.5)
WBC: 9.8 K/uL (ref 4.0–10.5)
nRBC: 0 % (ref 0.0–0.2)

## 2024-05-11 LAB — URINALYSIS, ROUTINE W REFLEX MICROSCOPIC
Bilirubin Urine: NEGATIVE
Glucose, UA: 50 mg/dL — AB
Hgb urine dipstick: NEGATIVE
Ketones, ur: NEGATIVE mg/dL
Leukocytes,Ua: NEGATIVE
Nitrite: NEGATIVE
Protein, ur: NEGATIVE mg/dL
Specific Gravity, Urine: 1.03 (ref 1.005–1.030)
pH: 5 (ref 5.0–8.0)

## 2024-05-11 LAB — LIPASE, BLOOD: Lipase: 28 U/L (ref 11–51)

## 2024-05-11 MED ORDER — FENTANYL CITRATE (PF) 100 MCG/2ML IJ SOLN
50.0000 ug | Freq: Once | INTRAMUSCULAR | Status: AC
  Administered 2024-05-11: 50 ug via INTRAVENOUS
  Filled 2024-05-11: qty 2

## 2024-05-11 MED ORDER — SODIUM CHLORIDE 0.9 % IV BOLUS
1000.0000 mL | Freq: Once | INTRAVENOUS | Status: AC
  Administered 2024-05-11: 1000 mL via INTRAVENOUS

## 2024-05-11 MED ORDER — IOHEXOL 300 MG/ML  SOLN
100.0000 mL | Freq: Once | INTRAMUSCULAR | Status: AC | PRN
  Administered 2024-05-11: 100 mL via INTRAVENOUS

## 2024-05-11 MED ORDER — POLYETHYLENE GLYCOL 3350 17 GM/SCOOP PO POWD: 17.0000 g | Freq: Every day | ORAL | 0 refills | Status: AC

## 2024-05-11 MED ORDER — ONDANSETRON HCL 4 MG/2ML IJ SOLN
4.0000 mg | Freq: Once | INTRAMUSCULAR | Status: AC
  Administered 2024-05-11: 4 mg via INTRAVENOUS
  Filled 2024-05-11: qty 2

## 2024-05-11 NOTE — Discharge Instructions (Addendum)
 CT scan did not show any concerning findings.  It did show you are constipated.  Take MiraLAX as discussed.  For the first 3 to 4 days you can take 3 capfuls daily and then cut back to once a day once you have the desired effect on your bowels. Also encourage you to follow-up with your primary care doctor to discuss better control of your diabetes. Return for any concerning symptoms.  I prescribed MiraLAX for you.  Pick this up if your insurance covers it otherwise you can get this over-the-counter.

## 2024-05-11 NOTE — ED Provider Notes (Signed)
 [facility] Provider Note   CSN: [id number] Arrival date & time: 05/11/24  1224     Patient presents with: Abdominal Pain   Douglas Hayes is a 53 y.o. male.   53 year old male presents with his wife for concern of abdominal fullness that has been occurring for the past 3 to 4 days.  This has led to him not eating any significant amount.  Today he had 3 episode of emesis.  He denies constipation, dysuria, hematuria.  Has had appendectomy 7 years ago but otherwise denies any abdominal surgeries.  No hematemesis.  He does drink socially.  States typically a beer on Friday and Saturday nights.  Except he did not drink any this weekend because he was not feeling well.  The history is provided by the patient. No language interpreter was used.       Prior to Admission medications   Medication Sig Start Date End Date Taking? Authorizing Provider  azelastine  (OPTIVAR ) 0.05 % ophthalmic solution Place 1 drop into both eyes 2 (two) times daily. 02/08/21   Christopher Savannah, PA-C  benzonatate  (TESSALON ) 100 MG capsule Take 1 capsule (100 mg total) by mouth every 8 (eight) hours. 11/04/19   Joy, Shawn C, PA-C  empagliflozin  (JARDIANCE ) 10 MG TABS tablet Take 1 tablet (10 mg total) by mouth daily. 06/02/23     metFORMIN  (GLUCOPHAGE ) 500 MG tablet Take 2 tablets (1,000 mg total) by mouth 2 (two) times daily. 06/02/23     naproxen  (NAPROSYN ) 500 MG tablet Take 1 tablet (500 mg total) by mouth 2 (two) times daily with a meal. 06/05/21   Christopher Savannah, PA-C  ondansetron  (ZOFRAN  ODT) 4 MG disintegrating tablet Take 1 tablet (4 mg total) by mouth every 8 (eight) hours as needed for nausea or vomiting. 11/04/19   Joy, Shawn C, PA-C  tiZANidine  (ZANAFLEX ) 4 MG tablet Take 1 tablet (4 mg total) by mouth at bedtime. 06/05/21   Christopher Savannah, PA-C    Allergies: Shrimp [shellfish allergy]    Review of Systems  Constitutional:  Negative for chills and fever.  Respiratory:   Negative for shortness of breath.   Cardiovascular:  Negative for chest pain.  Gastrointestinal:  Positive for abdominal pain, nausea and vomiting. Negative for constipation.  Genitourinary:  Negative for difficulty urinating and dysuria.  Neurological:  Negative for light-headedness.  All other systems reviewed and are negative.   Updated Vital Signs BP 113/80 (BP Location: Left Arm)   Pulse 71   Temp 98.3 F (36.8 C) (Oral)   Resp 17   Ht 5' 6 (1.676 m)   Wt 110.4 kg   SpO2 98%   BMI 39.28 kg/m   Physical Exam Vitals and nursing note reviewed.  Constitutional:      General: He is not in acute distress.    Appearance: Normal appearance. He is not ill-appearing.  HENT:     Head: Normocephalic and atraumatic.     Nose: Nose normal.  Eyes:     Conjunctiva/sclera: Conjunctivae normal.  Cardiovascular:     Rate and Rhythm: Normal rate and regular rhythm.  Pulmonary:     Effort: Pulmonary effort is normal. No respiratory distress.  Abdominal:     General: There is no distension.     Palpations: Abdomen is soft.     Tenderness: There is no abdominal tenderness. There is no guarding.  Musculoskeletal:        General: No deformity. Normal range of motion.  Cervical back: Normal range of motion.  Skin:    Findings: No rash.  Neurological:     Mental Status: He is alert.     (all labs ordered are listed, but only abnormal results are displayed) Labs Reviewed  URINALYSIS, ROUTINE W REFLEX MICROSCOPIC - Abnormal; Notable for the following components:      Result Value   APPearance HAZY (*)    Glucose, UA 50 (*)    All other components within normal limits  CBC  LIPASE, BLOOD  COMPREHENSIVE METABOLIC PANEL WITH GFR    EKG: None  Radiology: No results found.   Procedures   Medications Ordered in the ED  ondansetron  (ZOFRAN ) injection 4 mg (has no administration in time range)  sodium chloride  0.9 % bolus 1,000 mL (has no administration in time range)   fentaNYL  (SUBLIMAZE ) injection 50 mcg (has no administration in time range)                                    Medical Decision Making Amount and/or Complexity of Data Reviewed Labs: ordered. Radiology: ordered.  Risk Prescription drug management.   Medical Decision Making / ED Course   This patient presents to the ED for concern of abdominal pain, this involves an extensive number of treatment options, and is a complaint that carries with it a high risk of complications and morbidity.  The differential diagnosis includes constipation, nephrolithiasis, pancreatitis, colitis, gastroenteritis, pyelonephritis, UTI  MDM: 53 year old male presents with above-mentioned complaints. Overall well-appearing.  Hemodynamically stable. Will obtain labs, provide symptomatic management and obtain CT.  Will reevaluate.  CBC unremarkable, CMP without acute concern.  Glucose 186.  Lipase within normal.  UA without evidence of UTI.  CT abdomen pelvis shows stool burden with slow transit.  He states that his diabetes is pretty uncontrolled and his A1c was 15.  He has been refusing insulin so he was placed on higher dose of metformin  and started on Jardiance .  Advised him the importance of good glucose control.  And the complications of poor diabetes management.  Discussed diet changes.  Wife is at bedside.  They voiced understanding and are in agreement with plan.  Rest of bowel regimen discussed as well.  Discussed close follow-up with their PCP.  He states he has a follow-up scheduled for 11/11.  Discharged in stable condition.    Lab Tests: -I ordered, reviewed, and interpreted labs.   The pertinent results include:   Labs Reviewed  URINALYSIS, ROUTINE W REFLEX MICROSCOPIC - Abnormal; Notable for the following components:      Result Value   APPearance HAZY (*)    Glucose, UA 50 (*)    All other components within normal limits  CBC  LIPASE, BLOOD  COMPREHENSIVE METABOLIC PANEL WITH GFR       EKG  EKG Interpretation Date/Time:    Ventricular Rate:    PR Interval:    QRS Duration:    QT Interval:    QTC Calculation:   R Axis:      Text Interpretation:           Imaging Studies ordered: I ordered imaging studies including CT abdomen pelvis with contrast I independently visualized and interpreted imaging. I agree with the radiologist interpretation   Medicines ordered and prescription drug management: Meds ordered this encounter  Medications   ondansetron  (ZOFRAN ) injection 4 mg   sodium chloride  0.9 % bolus 1,000  mL   fentaNYL  (SUBLIMAZE ) injection 50 mcg    -I have reviewed the patients home medicines and have made adjustments as needed   Reevaluation: After the interventions noted above, I reevaluated the patient and found that they have :improved  Co morbidities that complicate the patient evaluation  Past Medical History:  Diagnosis Date   Diabetes mellitus without complication (HCC)    Hypertension    off meds since 2016   Obesity       Dispostion: Discharged in stable condition.  Return precaution discussed.  Final diagnoses:  Pain of upper abdomen    ED Discharge Orders          Ordered    polyethylene glycol powder (GLYCOLAX/MIRALAX) 17 GM/SCOOP powder  Daily        05/11/24 1938               Hildegard Loge, PA-C 05/11/24 1939    Dean Clarity, MD 05/11/24 2045

## 2024-05-11 NOTE — ED Triage Notes (Signed)
 Pt arrived via POV c/o upper abdominal pain for the past few days. Pt denies difficulty with bowel movements and describes pain as being a lot of pressure.

## 2024-05-25 DIAGNOSIS — Z712 Person consulting for explanation of examination or test findings: Secondary | ICD-10-CM | POA: Diagnosis not present

## 2024-05-25 DIAGNOSIS — Z1389 Encounter for screening for other disorder: Secondary | ICD-10-CM | POA: Diagnosis not present

## 2024-05-25 DIAGNOSIS — E119 Type 2 diabetes mellitus without complications: Secondary | ICD-10-CM | POA: Diagnosis not present

## 2024-05-25 DIAGNOSIS — R7309 Other abnormal glucose: Secondary | ICD-10-CM | POA: Diagnosis not present

## 2024-05-25 DIAGNOSIS — Z1211 Encounter for screening for malignant neoplasm of colon: Secondary | ICD-10-CM | POA: Diagnosis not present
